# Patient Record
Sex: Female | Born: 1935 | Race: Black or African American | Hispanic: No | Smoking: Never smoker
Health system: Southern US, Community
[De-identification: ages and names within clinical notes are randomized; demographics above are authoritative.]

## PROBLEM LIST (undated history)

## (undated) DIAGNOSIS — K639 Disease of intestine, unspecified: Secondary | ICD-10-CM

## (undated) DIAGNOSIS — R0609 Other forms of dyspnea: Secondary | ICD-10-CM

## (undated) DIAGNOSIS — E7800 Pure hypercholesterolemia, unspecified: Secondary | ICD-10-CM

## (undated) DIAGNOSIS — R011 Cardiac murmur, unspecified: Secondary | ICD-10-CM

## (undated) DIAGNOSIS — R609 Edema, unspecified: Secondary | ICD-10-CM

## (undated) DIAGNOSIS — I10 Essential (primary) hypertension: Secondary | ICD-10-CM

## (undated) DIAGNOSIS — H6120 Impacted cerumen, unspecified ear: Secondary | ICD-10-CM

## (undated) DIAGNOSIS — R0600 Dyspnea, unspecified: Secondary | ICD-10-CM

## (undated) DIAGNOSIS — I493 Ventricular premature depolarization: Secondary | ICD-10-CM

## (undated) DIAGNOSIS — F0390 Unspecified dementia without behavioral disturbance: Secondary | ICD-10-CM

## (undated) DIAGNOSIS — I5032 Chronic diastolic (congestive) heart failure: Secondary | ICD-10-CM

## (undated) DIAGNOSIS — M47812 Spondylosis without myelopathy or radiculopathy, cervical region: Secondary | ICD-10-CM

## (undated) DIAGNOSIS — K5720 Diverticulitis of large intestine with perforation and abscess without bleeding: Secondary | ICD-10-CM

## (undated) DIAGNOSIS — G3184 Mild cognitive impairment, so stated: Secondary | ICD-10-CM

## (undated) HISTORY — DX: Spondylosis without myelopathy or radiculopathy, cervical region: M47.812

## (undated) HISTORY — DX: Mild cognitive impairment of uncertain or unknown etiology: G31.84

## (undated) HISTORY — DX: Essential (primary) hypertension: I10

## (undated) HISTORY — DX: Ventricular premature depolarization: I49.3

## (undated) HISTORY — DX: Pure hypercholesterolemia, unspecified: E78.00

## (undated) HISTORY — DX: Other forms of dyspnea: R06.09

## (undated) HISTORY — DX: Diverticulitis of large intestine with perforation and abscess without bleeding: K57.20

## (undated) HISTORY — DX: Unspecified dementia, unspecified severity, without behavioral disturbance, psychotic disturbance, mood disturbance, and anxiety: F03.90

## (undated) HISTORY — DX: Disease of intestine, unspecified: K63.9

## (undated) HISTORY — DX: Impacted cerumen, unspecified ear: H61.20

## (undated) HISTORY — DX: Edema, unspecified: R60.9

## (undated) HISTORY — DX: Dyspnea, unspecified: R06.00

## (undated) HISTORY — DX: Cardiac murmur, unspecified: R01.1

## (undated) HISTORY — DX: Chronic diastolic (congestive) heart failure: I50.32

---

## 2002-09-01 HISTORY — PX: GALLBLADDER SURGERY: SHX652

## 2021-02-07 ENCOUNTER — Ambulatory Visit: Payer: Self-pay | Admitting: Nurse Practitioner

## 2021-02-20 ENCOUNTER — Emergency Department (HOSPITAL_COMMUNITY): Payer: Medicare Other

## 2021-02-20 ENCOUNTER — Inpatient Hospital Stay (HOSPITAL_COMMUNITY)
Admission: EM | Admit: 2021-02-20 | Discharge: 2021-02-28 | DRG: 065 | Disposition: A | Discharge status: Skilled Nursing Facility | Payer: Medicare Other | Attending: Family Medicine | Admitting: Family Medicine

## 2021-02-20 ENCOUNTER — Observation Stay (HOSPITAL_COMMUNITY): Payer: Medicare Other

## 2021-02-20 DIAGNOSIS — Z781 Physical restraint status: Secondary | ICD-10-CM

## 2021-02-20 DIAGNOSIS — I6389 Other cerebral infarction: Secondary | ICD-10-CM

## 2021-02-20 DIAGNOSIS — R4701 Aphasia: Secondary | ICD-10-CM | POA: Diagnosis present

## 2021-02-20 DIAGNOSIS — R4182 Altered mental status, unspecified: Secondary | ICD-10-CM

## 2021-02-20 DIAGNOSIS — I63412 Cerebral infarction due to embolism of left middle cerebral artery: Secondary | ICD-10-CM | POA: Diagnosis not present

## 2021-02-20 DIAGNOSIS — E785 Hyperlipidemia, unspecified: Secondary | ICD-10-CM | POA: Diagnosis present

## 2021-02-20 DIAGNOSIS — Z806 Family history of leukemia: Secondary | ICD-10-CM

## 2021-02-20 DIAGNOSIS — Z8249 Family history of ischemic heart disease and other diseases of the circulatory system: Secondary | ICD-10-CM

## 2021-02-20 DIAGNOSIS — I639 Cerebral infarction, unspecified: Secondary | ICD-10-CM | POA: Diagnosis not present

## 2021-02-20 DIAGNOSIS — I5022 Chronic systolic (congestive) heart failure: Secondary | ICD-10-CM | POA: Diagnosis present

## 2021-02-20 DIAGNOSIS — G8191 Hemiplegia, unspecified affecting right dominant side: Secondary | ICD-10-CM | POA: Diagnosis present

## 2021-02-20 DIAGNOSIS — Z66 Do not resuscitate: Secondary | ICD-10-CM | POA: Diagnosis present

## 2021-02-20 DIAGNOSIS — R471 Dysarthria and anarthria: Secondary | ICD-10-CM | POA: Diagnosis present

## 2021-02-20 DIAGNOSIS — I480 Paroxysmal atrial fibrillation: Secondary | ICD-10-CM | POA: Diagnosis not present

## 2021-02-20 DIAGNOSIS — I959 Hypotension, unspecified: Secondary | ICD-10-CM | POA: Diagnosis not present

## 2021-02-20 DIAGNOSIS — I130 Hypertensive heart and chronic kidney disease with heart failure and stage 1 through stage 4 chronic kidney disease, or unspecified chronic kidney disease: Secondary | ICD-10-CM | POA: Diagnosis present

## 2021-02-20 DIAGNOSIS — Z20822 Contact with and (suspected) exposure to covid-19: Secondary | ICD-10-CM | POA: Diagnosis present

## 2021-02-20 DIAGNOSIS — N1832 Chronic kidney disease, stage 3b: Secondary | ICD-10-CM | POA: Diagnosis present

## 2021-02-20 DIAGNOSIS — Z823 Family history of stroke: Secondary | ICD-10-CM

## 2021-02-20 DIAGNOSIS — F0390 Unspecified dementia without behavioral disturbance: Secondary | ICD-10-CM | POA: Diagnosis present

## 2021-02-20 LAB — CBC
HCT: 41.1 % (ref 36.0–46.0)
Hemoglobin: 13.2 g/dL (ref 12.0–15.0)
MCH: 30.9 pg (ref 26.0–34.0)
MCHC: 32.1 g/dL (ref 30.0–36.0)
MCV: 96.3 fL (ref 80.0–100.0)
Platelets: 175 10*3/uL (ref 150–400)
RBC: 4.27 MIL/uL (ref 3.87–5.11)
RDW: 13.2 % (ref 11.5–15.5)
WBC: 6.2 10*3/uL (ref 4.0–10.5)
nRBC: 0 % (ref 0.0–0.2)

## 2021-02-20 LAB — DIFFERENTIAL
Abs Immature Granulocytes: 0.02 10*3/uL (ref 0.00–0.07)
Basophils Absolute: 0 10*3/uL (ref 0.0–0.1)
Basophils Relative: 1 %
Eosinophils Absolute: 0.2 10*3/uL (ref 0.0–0.5)
Eosinophils Relative: 2 %
Immature Granulocytes: 0 %
Lymphocytes Relative: 21 %
Lymphs Abs: 1.3 10*3/uL (ref 0.7–4.0)
Monocytes Absolute: 0.6 10*3/uL (ref 0.1–1.0)
Monocytes Relative: 10 %
Neutro Abs: 4.1 10*3/uL (ref 1.7–7.7)
Neutrophils Relative %: 66 %

## 2021-02-20 LAB — PROTIME-INR
INR: 0.9 (ref 0.8–1.2)
Prothrombin Time: 12.6 seconds (ref 11.4–15.2)

## 2021-02-20 LAB — COMPREHENSIVE METABOLIC PANEL
ALT: 10 U/L (ref 0–44)
AST: 22 U/L (ref 15–41)
Albumin: 3.5 g/dL (ref 3.5–5.0)
Alkaline Phosphatase: 62 U/L (ref 38–126)
Anion gap: 9 (ref 5–15)
BUN: 14 mg/dL (ref 8–23)
CO2: 24 mmol/L (ref 22–32)
Calcium: 9.2 mg/dL (ref 8.9–10.3)
Chloride: 107 mmol/L (ref 98–111)
Creatinine, Ser: 1.25 mg/dL — ABNORMAL HIGH (ref 0.44–1.00)
GFR, Estimated: 39 mL/min — ABNORMAL LOW (ref >60)
Glucose, Bld: 98 mg/dL (ref 70–99)
Potassium: 4 mmol/L (ref 3.5–5.1)
Sodium: 140 mmol/L (ref 135–145)
Total Bilirubin: 0.9 mg/dL (ref 0.3–1.2)
Total Protein: 6.1 g/dL — ABNORMAL LOW (ref 6.5–8.1)

## 2021-02-20 LAB — I-STAT CHEM 8, ED
BUN: 18 mg/dL (ref 8–23)
Calcium, Ion: 1.16 mmol/L (ref 1.15–1.40)
Chloride: 108 mmol/L (ref 98–111)
Creatinine, Ser: 1.2 mg/dL — ABNORMAL HIGH (ref 0.44–1.00)
Glucose, Bld: 97 mg/dL (ref 70–99)
HCT: 39 % (ref 36.0–46.0)
Hemoglobin: 13.3 g/dL (ref 12.0–15.0)
Potassium: 4.1 mmol/L (ref 3.5–5.1)
Sodium: 141 mmol/L (ref 135–145)
TCO2: 25 mmol/L (ref 22–32)

## 2021-02-20 LAB — ECHOCARDIOGRAM COMPLETE
AR max vel: 0.84 cm2
AV Area VTI: 0.77 cm2
AV Area mean vel: 0.75 cm2
AV Mean grad: 11.3 mmHg
AV Peak grad: 19 mmHg
Ao pk vel: 2.18 m/s
Area-P 1/2: 4.86 cm2
S' Lateral: 5.2 cm
Weight: 2045.87 oz

## 2021-02-20 LAB — RESP PANEL BY RT-PCR (FLU A&B, COVID) ARPGX2
Influenza A by PCR: NEGATIVE
Influenza B by PCR: NEGATIVE
SARS Coronavirus 2 by RT PCR: NEGATIVE

## 2021-02-20 LAB — APTT: aPTT: 28 seconds (ref 24–36)

## 2021-02-20 LAB — CBG MONITORING, ED: Glucose-Capillary: 88 mg/dL (ref 70–99)

## 2021-02-20 IMAGING — MR MR MRA NECK WO/W CM
7 of 8 series · 40 of 48 positions shown · IV contrast (Gadavist)
Comparison: Previous CT and MRI from earlier the same day.

CLINICAL DATA: Initial evaluation for acute stroke.

EXAM:
MRA HEAD WITHOUT CONTRAST
MRA NECK WITHOUT AND WITH CONTRAST
TECHNIQUE: Angiographic images of the Circle of Willis were obtained using MRA
technique without intravenous contrast. Angiographic images of the
neck were obtained using MRA technique without and with intravenous
contrast. Carotid stenosis measurements (when applicable) are
obtained utilizing NASCET criteria, using the distal internal
carotid diameter as the denominator.
CONTRAST:  5mL GADAVIST GADOBUTROL 1 MMOL/ML IV SOLN

[Series 6: tof_fl3d_tra_iso · axial · 0.6mm · 0.52mm/px · z∈[-230,-154]mm · 8 of 133 slices shown]
[im 1/133]
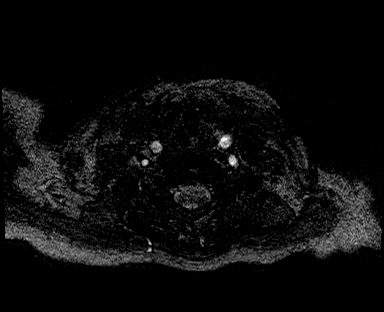
[im 19/133]
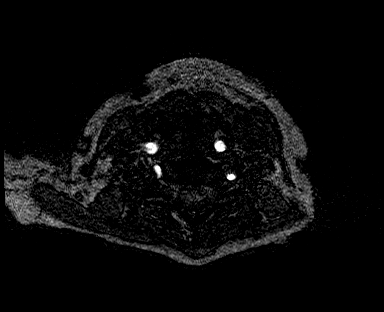
[im 38/133]
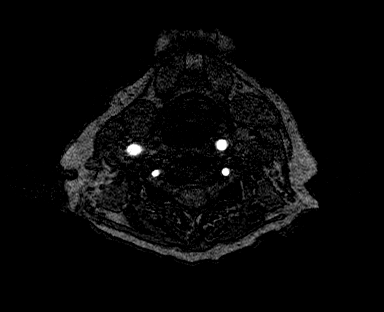
[im 57/133]
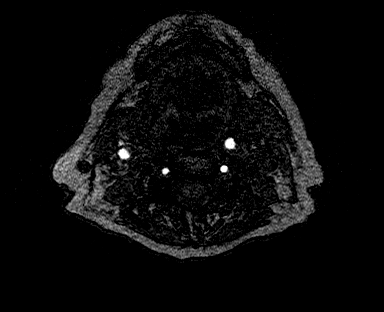
[im 76/133]
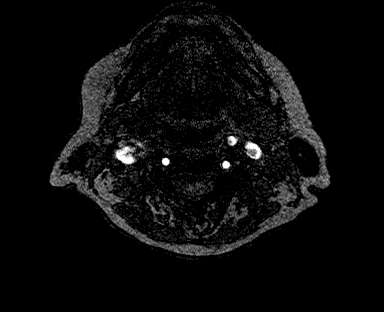
[im 95/133]
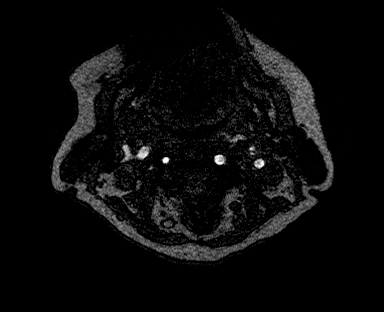
[im 114/133]
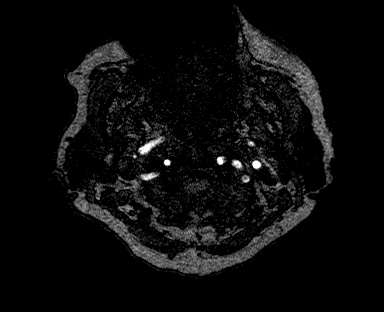
[im 133/133]
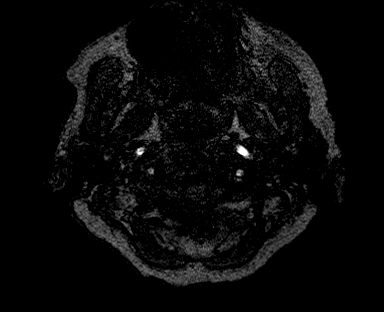

[Series 9: angio_fl3d_cor_pre_ttc=3.0s · coronal · 0.9mm · 0.85mm/px · 5 of 80 slices shown]
[im 1/80]
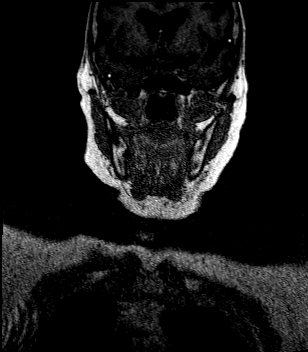
[im 20/80]
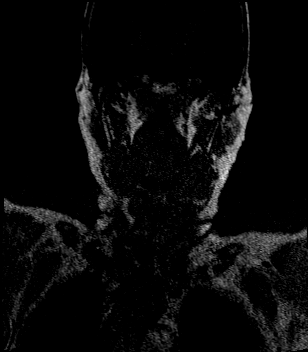
[im 40/80]
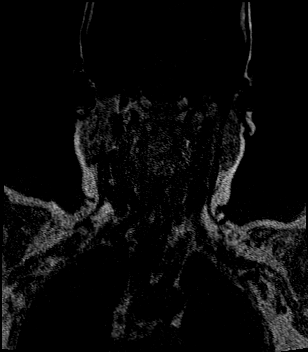
[im 60/80]
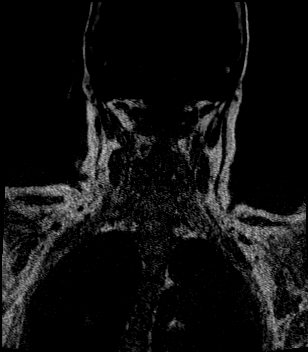
[im 80/80]
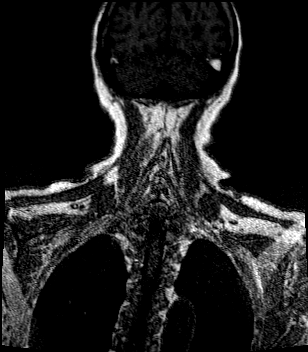

[Series 11: angio_fl3d_cor_post_ttc=3.0s · coronal · 0.9mm · 0.85mm/px · 5 of 80 slices shown (1 of 2)]
[im 1/80]
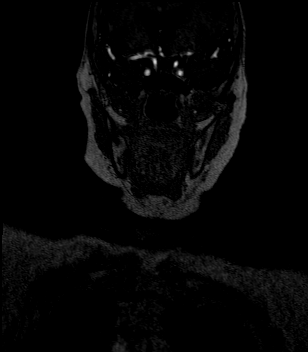
[im 20/80]
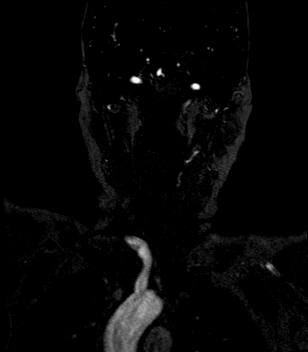
[im 40/80]
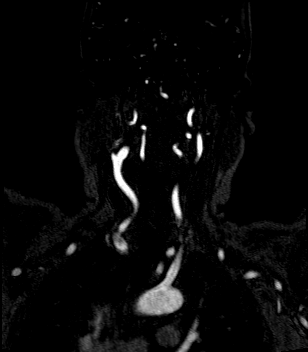
[im 60/80]
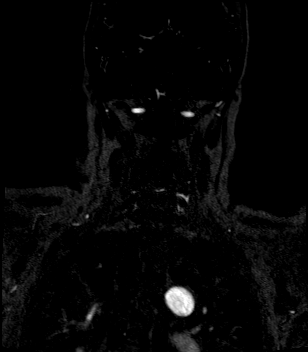
[im 80/80]
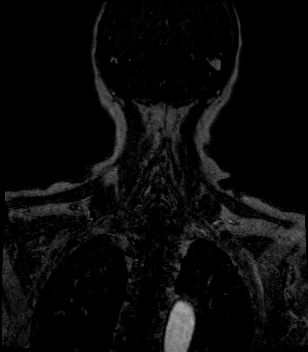

[Series 12: angio_fl3d_cor_post_ttc=3.0s_moco-adv · coronal · 0.9mm · 0.85mm/px · 6 of 80 slices shown (1 of 2)]
[im 1/80]
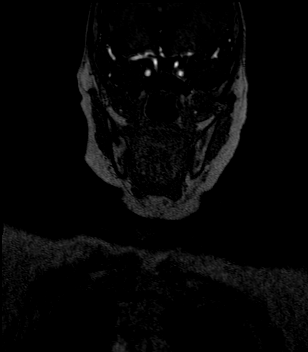
[im 16/80]
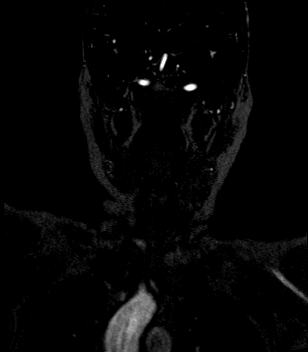
[im 32/80]
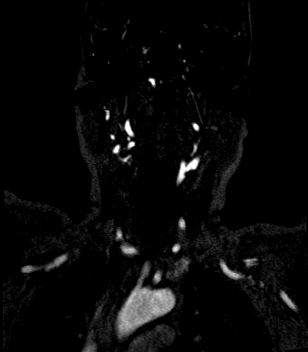
[im 48/80]
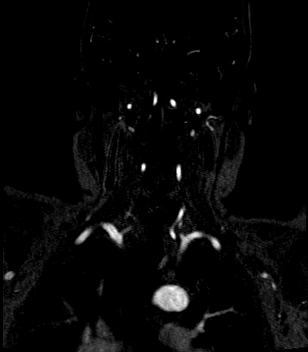
[im 64/80]
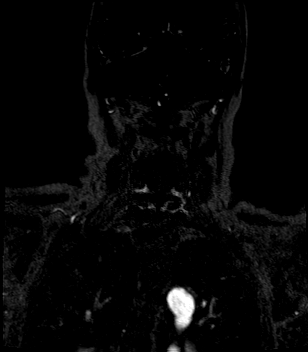
[im 80/80]
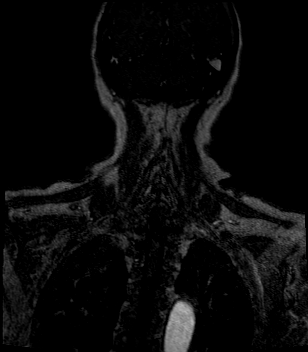

[Series 13: angio_fl3d_cor_post_ttc=3.0s_moco-adv_sub · coronal · 0.9mm · 0.85mm/px · 6 of 80 slices shown]
[im 1/80]
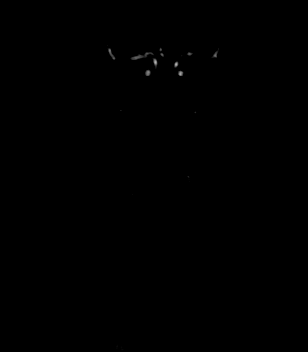
[im 16/80]
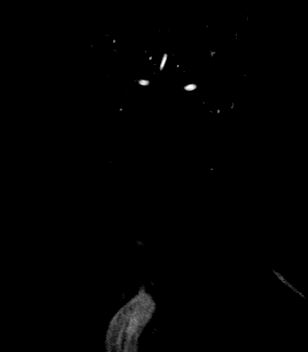
[im 32/80]
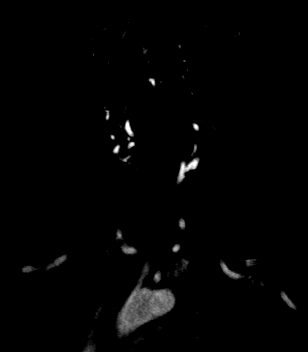
[im 48/80]
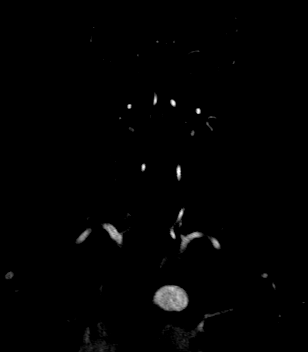
[im 64/80]
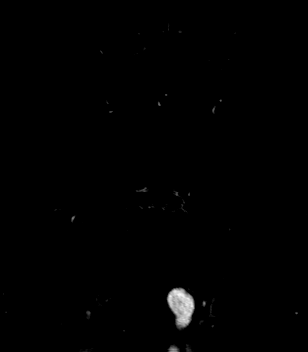
[im 80/80]
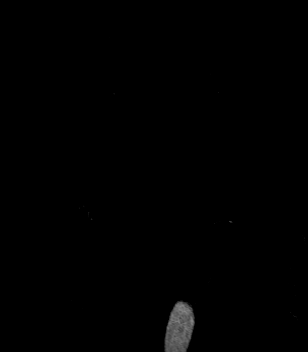

[Series 15: angio_fl3d_cor_post_ttc=3.0s · coronal · 0.9mm · 0.85mm/px · 6 of 80 slices shown (2 of 2)]
[im 1/80]
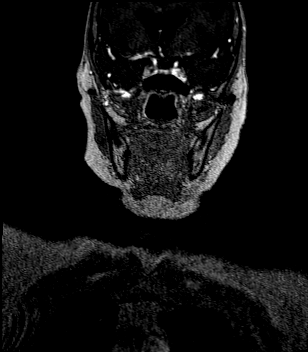
[im 16/80]
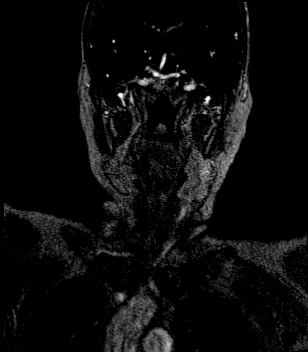
[im 32/80]
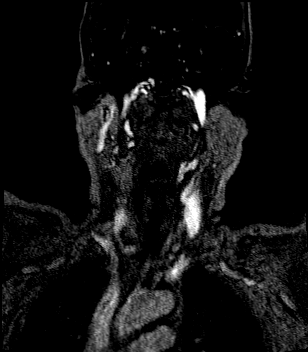
[im 48/80]
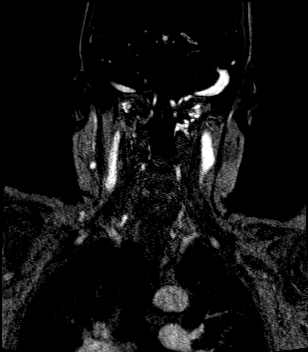
[im 64/80]
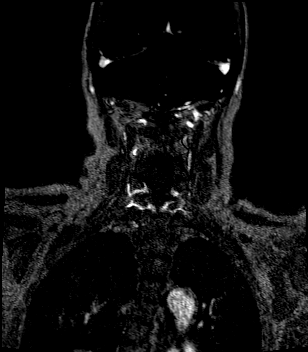
[im 80/80]
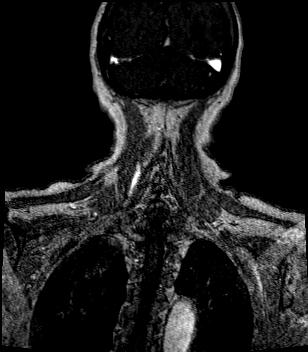

[Series 16: angio_fl3d_cor_post_ttc=3.0s_moco-adv · coronal · 0.9mm · 0.85mm/px · 4 of 80 slices shown (2 of 2)]
[im 1/80]
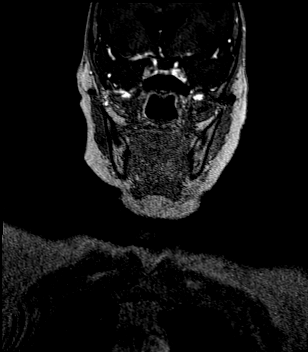
[im 16/80]
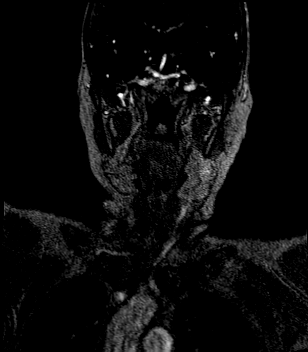
[im 32/80]
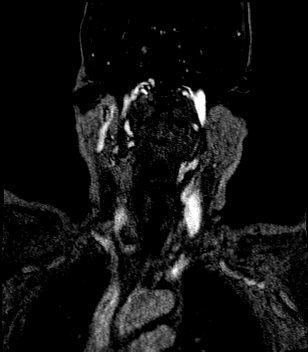
[im 48/80]
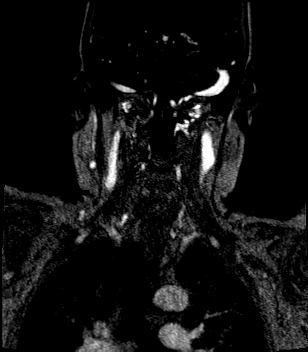

[40 of 48 positions shown; findings below may reference images not displayed]

FINDINGS: MRA HEAD FINDINGS

ANTERIOR CIRCULATION:

Visualized distal cervical segments of the internal carotid arteries
are patent with antegrade flow. Petrous, cavernous, and supraclinoid
segments patent without stenosis. 3 mm focal outpouching at the
expected takeoff of the right posterior communicating artery is seen
(series 7, image 97), favored to reflect a small vascular
infundibulum, although a small aneurysm is difficult to exclude. A1
segments patent bilaterally. Normal anterior communicating artery
complex. Anterior cerebral arteries mildly irregular but are patent
to their distal aspects without high-grade stenosis. M1 segments
patent bilaterally without significant stenosis. Normal MCA
bifurcations. Distal MCA branches well perfused and symmetric,
although demonstrate diffuse small vessel atheromatous irregularity.

POSTERIOR CIRCULATION:

Both vertebral arteries patent to the vertebrobasilar junction
without stenosis. Left vertebral artery slightly dominant. Right
PICA origin patent and normal. Left PICA not definitely seen.
Basilar widely patent to its distal aspect without stenosis.
Superior cerebellar arteries patent bilaterally. Scattered
atheromatous irregularity seen within the PCAs without high-grade
stenosis. PCAs remain well perfused to their distal aspects. A small
left posterior communicating artery noted.

MRA NECK FINDINGS

AORTIC ARCH: Visualized aortic arch normal in caliber with normal 3
vessel morphology. No hemodynamically significant stenosis seen
about the origin of the great vessels.

RIGHT CAROTID SYSTEM: Right CCA mildly tortuous but is patent to the
bifurcation without stenosis. No significant atheromatous narrowing
or irregularity about the right carotid bulb or proximal right ICA.
Right ICA tortuous but widely patent to the skull base without
stenosis, evidence for dissection or occlusion.

LEFT CAROTID SYSTEM: Left CCA mildly tortuous but is widely patent
to the bifurcation. Eccentric plaque at the left carotid
bulb/proximal left ICA with associated stenosis of up to 60% by
NASCET criteria (series [RH], image 7). Left ICA tortuous but is
widely patent distally without stenosis, evidence for dissection, or
occlusion.

VERTEBRAL ARTERIES: Both vertebral arteries arise from the
subclavian arteries. No proximal subclavian artery stenosis.
Vertebral arteries somewhat tortuous but are widely patent without
stenosis, evidence for dissection or occlusion.
IMPRESSION: MRA HEAD IMPRESSION:

1. Negative intracranial MRA for large vessel occlusion.
2. Diffuse intracranial atherosclerotic disease, overall mild for
age. No hemodynamically significant or correctable stenosis.
3. 3 mm focal outpouching arising from the supraclinoid right ICA.
While this is favored to reflect a vascular infundibulum, a small
aneurysm is difficult to exclude. Attention at follow-up
recommended.

MRA NECK IMPRESSION:

1. 60% atheromatous stenosis at the left carotid bulb/proximal left
ICA.
2. No hemodynamically significant stenosis about the right carotid
artery system.
3. Wide patency of the vertebral arteries within the neck.
4. Diffuse tortuosity the major arterial vasculature of the neck,
suggesting chronic underlying hypertension.

## 2021-02-20 IMAGING — MR MR MRA HEAD W/O CM
1 series · 22 of 48 positions shown · IV contrast (gadavist)
Comparison: Previous CT and MRI from earlier the same day.

CLINICAL DATA: Initial evaluation for acute stroke.

EXAM:
MRA HEAD WITHOUT CONTRAST
MRA NECK WITHOUT AND WITH CONTRAST
TECHNIQUE: Angiographic images of the Circle of Willis were obtained using MRA
technique without intravenous contrast. Angiographic images of the
neck were obtained using MRA technique without and with intravenous
contrast. Carotid stenosis measurements (when applicable) are
obtained utilizing NASCET criteria, using the distal internal
carotid diameter as the denominator.
CONTRAST:  5mL GADAVIST GADOBUTROL 1 MMOL/ML IV SOLN

[Series 7: 3d cow · axial · 0.5mm · 0.41mm/px · z∈[-150,-72]mm · 22 of 172 slices shown]
[im 1/172]
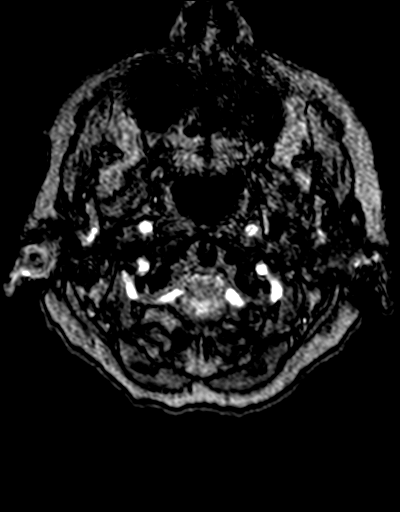
[im 4/172]
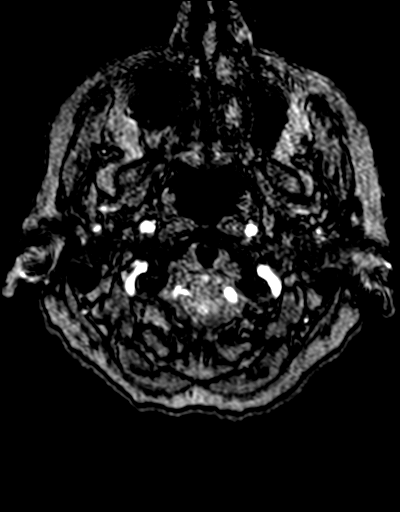
[im 8/172]
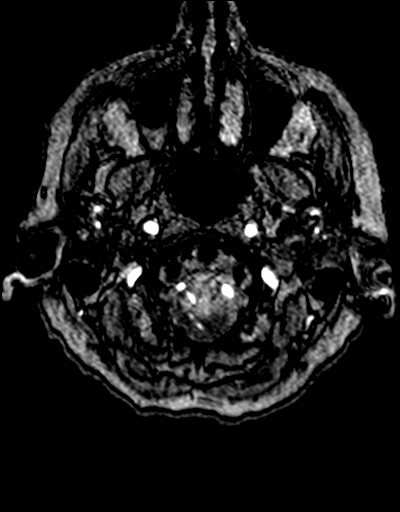
[im 11/172]
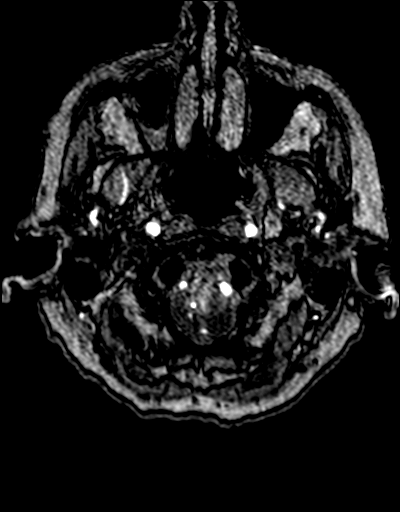
[im 15/172]
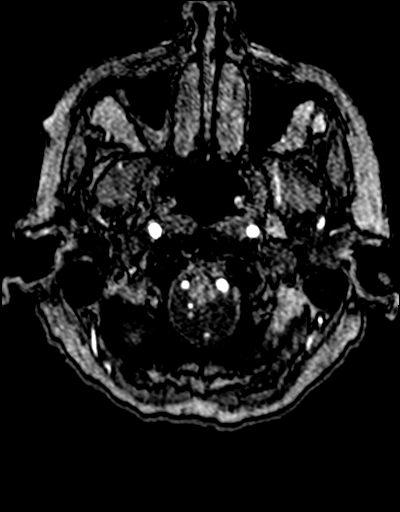
[im 19/172]
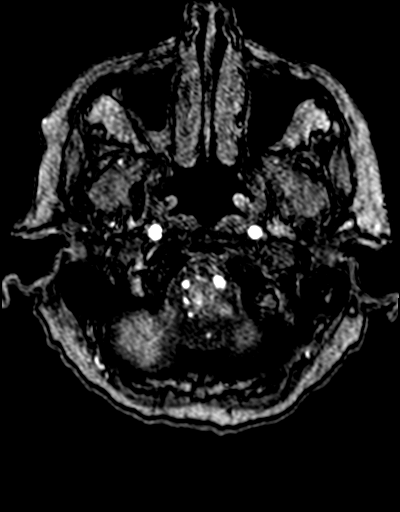
[im 22/172]
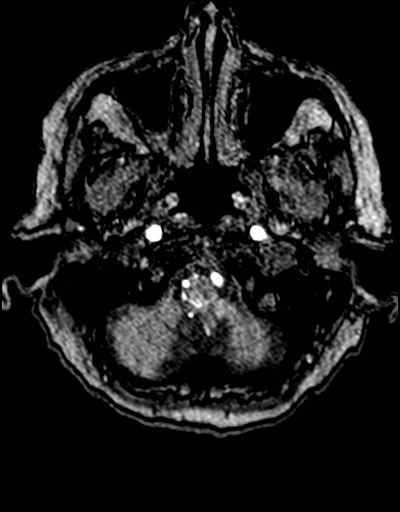
[im 26/172]
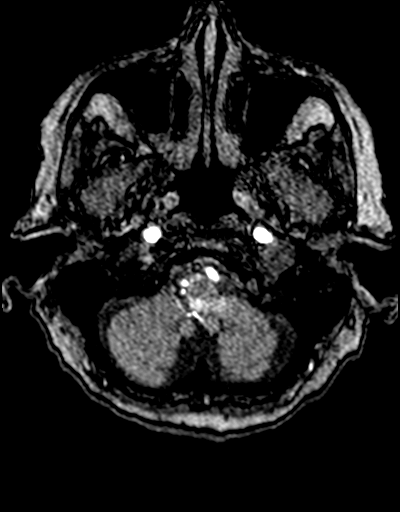
[im 30/172]
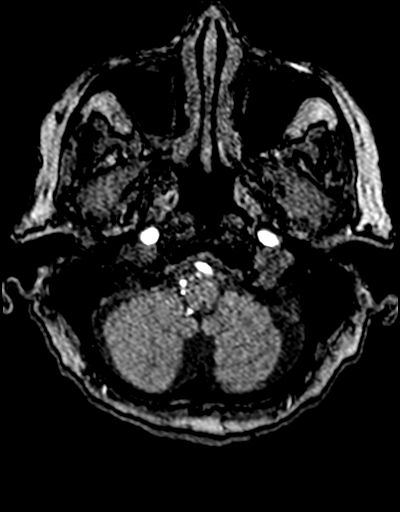
[im 33/172]
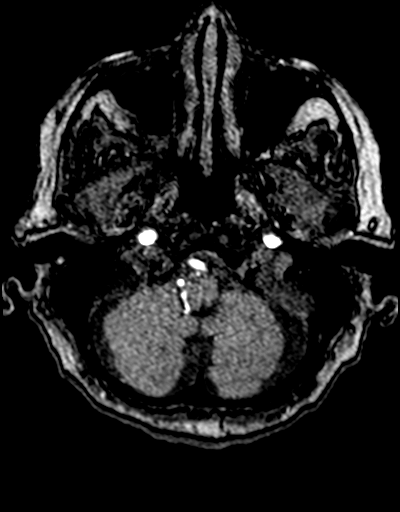
[im 37/172]
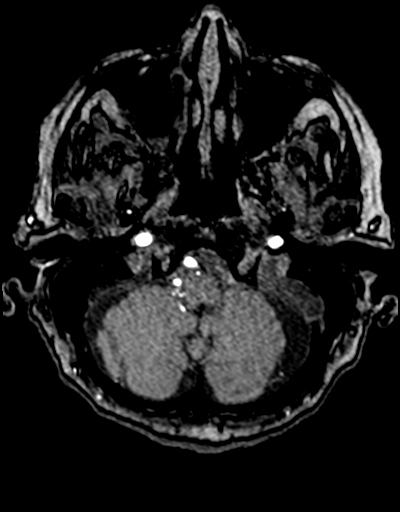
[im 41/172]
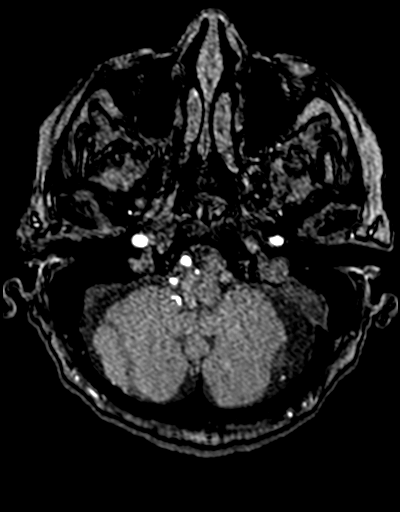
[im 44/172]
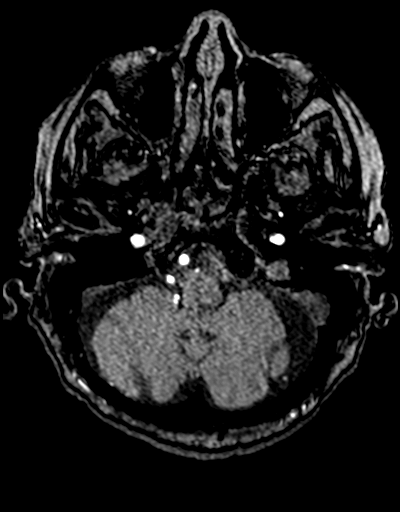
[im 48/172]
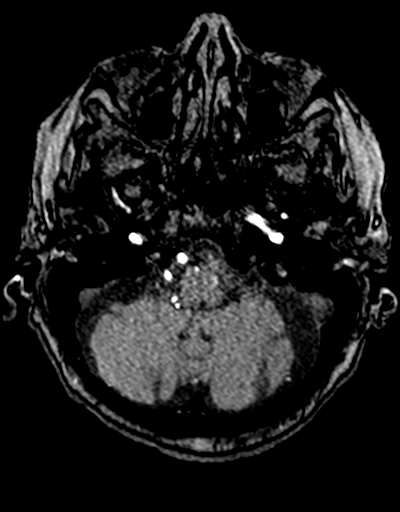
[im 55/172]
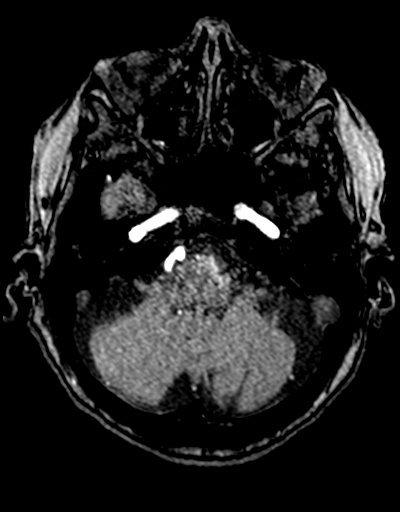
[im 77/172]
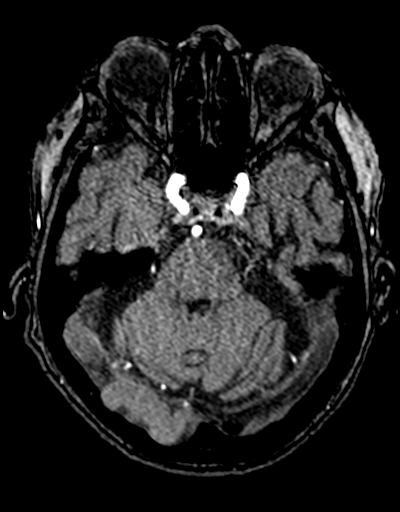
[im 88/172]
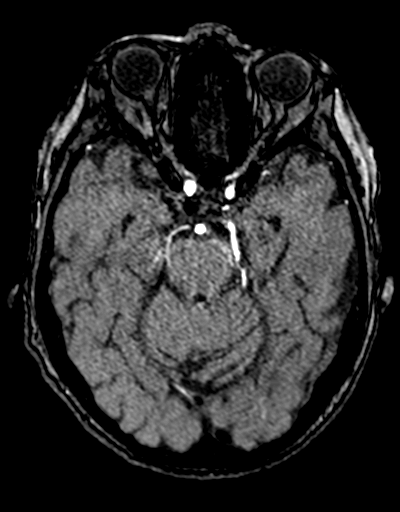
[im 99/172]
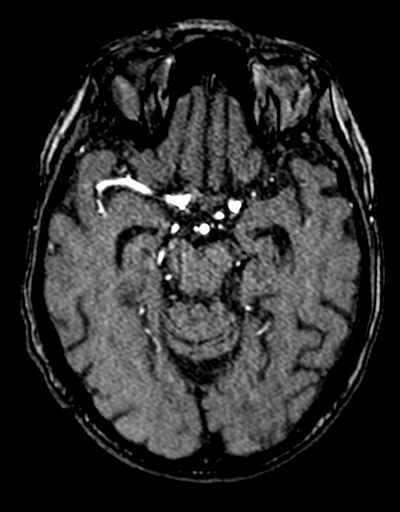
[im 121/172]
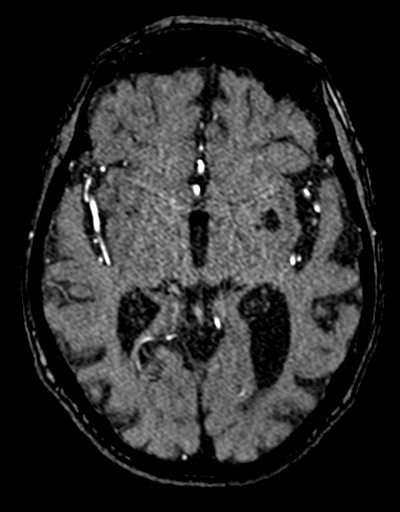
[im 142/172]
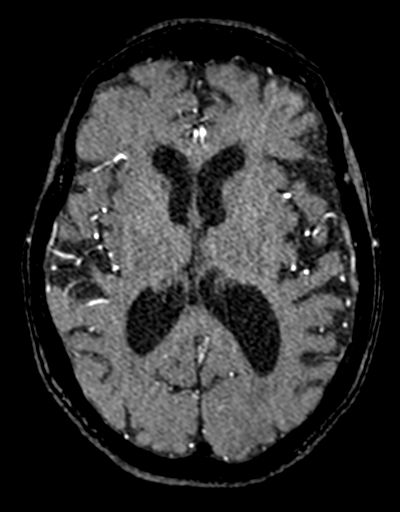
[im 146/172]
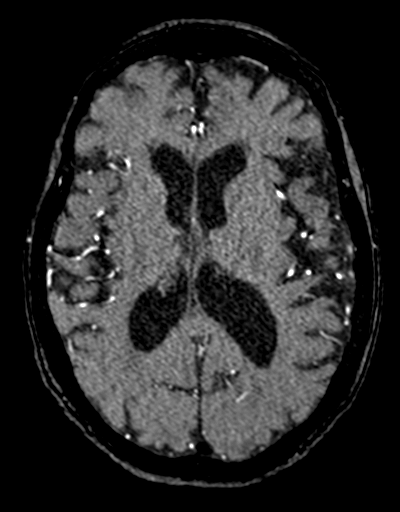
[im 164/172]
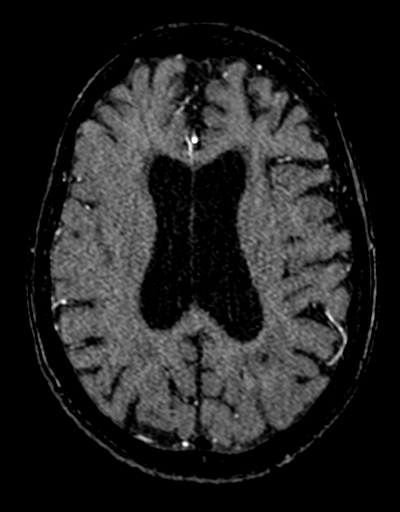

[22 of 48 positions shown; findings below may reference images not displayed]

FINDINGS: MRA HEAD FINDINGS

ANTERIOR CIRCULATION:

Visualized distal cervical segments of the internal carotid arteries
are patent with antegrade flow. Petrous, cavernous, and supraclinoid
segments patent without stenosis. 3 mm focal outpouching at the
expected takeoff of the right posterior communicating artery is seen
(series 7, image 97), favored to reflect a small vascular
infundibulum, although a small aneurysm is difficult to exclude. A1
segments patent bilaterally. Normal anterior communicating artery
complex. Anterior cerebral arteries mildly irregular but are patent
to their distal aspects without high-grade stenosis. M1 segments
patent bilaterally without significant stenosis. Normal MCA
bifurcations. Distal MCA branches well perfused and symmetric,
although demonstrate diffuse small vessel atheromatous irregularity.

POSTERIOR CIRCULATION:

Both vertebral arteries patent to the vertebrobasilar junction
without stenosis. Left vertebral artery slightly dominant. Right
PICA origin patent and normal. Left PICA not definitely seen.
Basilar widely patent to its distal aspect without stenosis.
Superior cerebellar arteries patent bilaterally. Scattered
atheromatous irregularity seen within the PCAs without high-grade
stenosis. PCAs remain well perfused to their distal aspects. A small
left posterior communicating artery noted.

MRA NECK FINDINGS

AORTIC ARCH: Visualized aortic arch normal in caliber with normal 3
vessel morphology. No hemodynamically significant stenosis seen
about the origin of the great vessels.

RIGHT CAROTID SYSTEM: Right CCA mildly tortuous but is patent to the
bifurcation without stenosis. No significant atheromatous narrowing
or irregularity about the right carotid bulb or proximal right ICA.
Right ICA tortuous but widely patent to the skull base without
stenosis, evidence for dissection or occlusion.

LEFT CAROTID SYSTEM: Left CCA mildly tortuous but is widely patent
to the bifurcation. Eccentric plaque at the left carotid
bulb/proximal left ICA with associated stenosis of up to 60% by
NASCET criteria (series [RH], image 7). Left ICA tortuous but is
widely patent distally without stenosis, evidence for dissection, or
occlusion.

VERTEBRAL ARTERIES: Both vertebral arteries arise from the
subclavian arteries. No proximal subclavian artery stenosis.
Vertebral arteries somewhat tortuous but are widely patent without
stenosis, evidence for dissection or occlusion.
IMPRESSION: MRA HEAD IMPRESSION:

1. Negative intracranial MRA for large vessel occlusion.
2. Diffuse intracranial atherosclerotic disease, overall mild for
age. No hemodynamically significant or correctable stenosis.
3. 3 mm focal outpouching arising from the supraclinoid right ICA.
While this is favored to reflect a vascular infundibulum, a small
aneurysm is difficult to exclude. Attention at follow-up
recommended.

MRA NECK IMPRESSION:

1. 60% atheromatous stenosis at the left carotid bulb/proximal left
ICA.
2. No hemodynamically significant stenosis about the right carotid
artery system.
3. Wide patency of the vertebral arteries within the neck.
4. Diffuse tortuosity the major arterial vasculature of the neck,
suggesting chronic underlying hypertension.

## 2021-02-20 IMAGING — CT CT HEAD CODE STROKE
4 series · 16 of 47 positions shown, 18 images · non-contrast
Comparison: None.

CLINICAL DATA: Code stroke.  Right-sided weakness and facial droop

EXAM:
CT HEAD WITHOUT CONTRAST
TECHNIQUE: Contiguous axial images were obtained from the base of the skull
through the vertex without intravenous contrast.

[Series 2: head wo · axial · 0.45mm/px · z∈[+1285,+1400]mm · 7 of 31 slices shown, 9 images]
[im 4/31  brain]
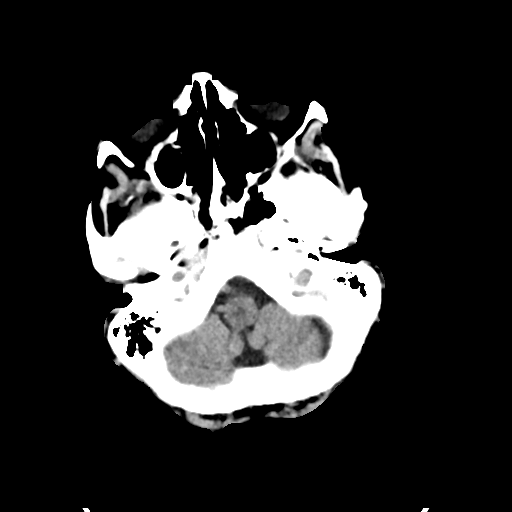
[im 4/31  bone]
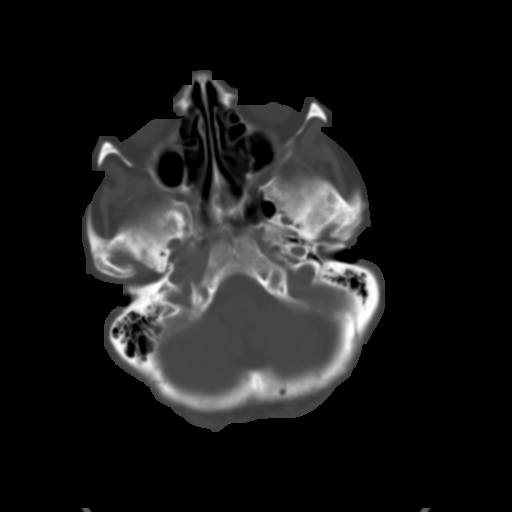
[im 8/31  brain]
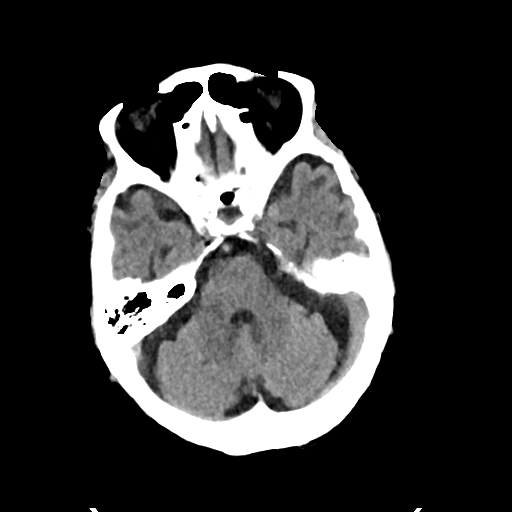
[im 12/31  brain]
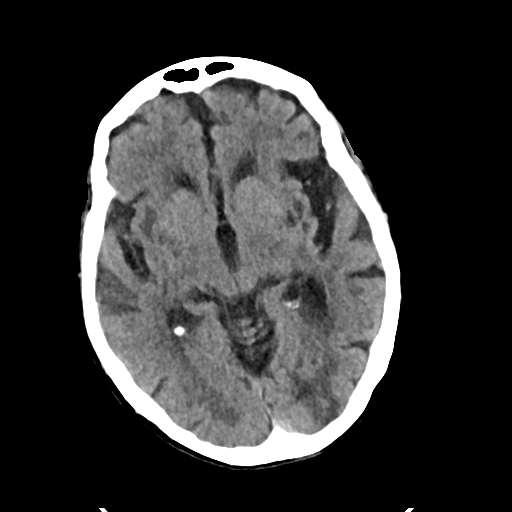
[im 16/31  brain]
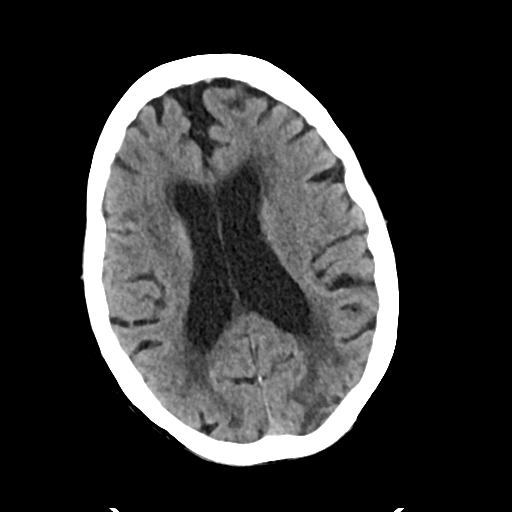
[im 19/31  brain]
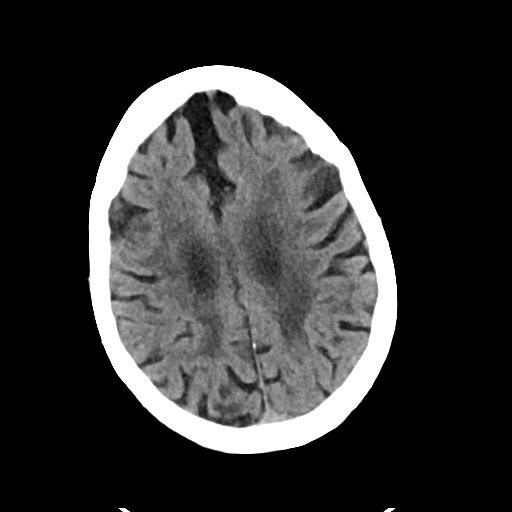
[im 19/31  bone]
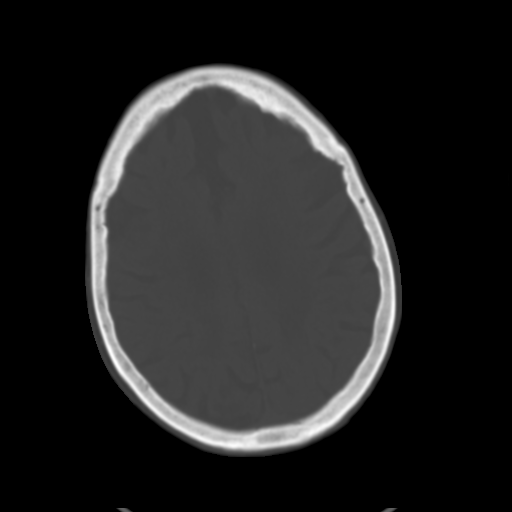
[im 23/31  brain]
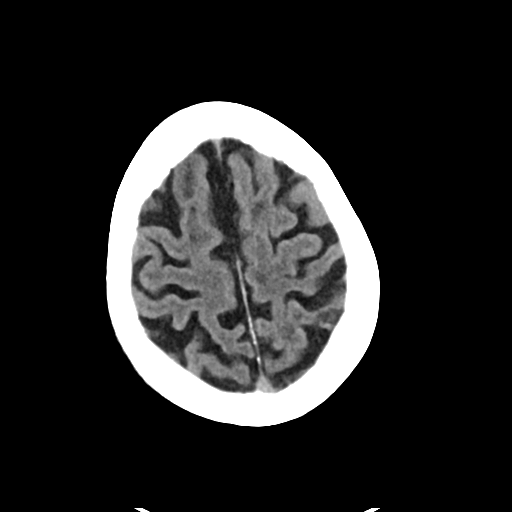
[im 27/31  brain]
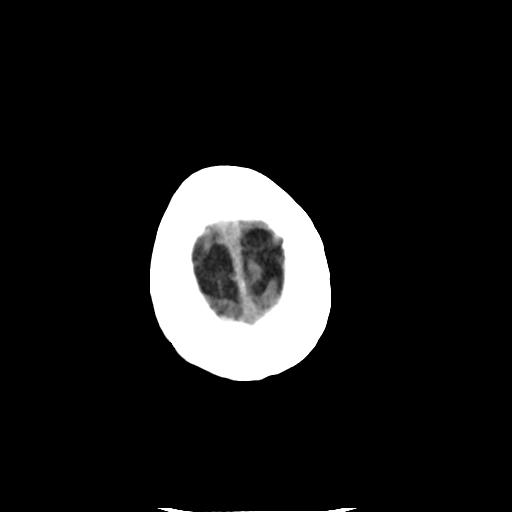

[Series 3: head bone · axial · 0.45mm/px · z∈[+1284,+1314]mm · 3 of 76 slices shown]
[im 8/76  bone]
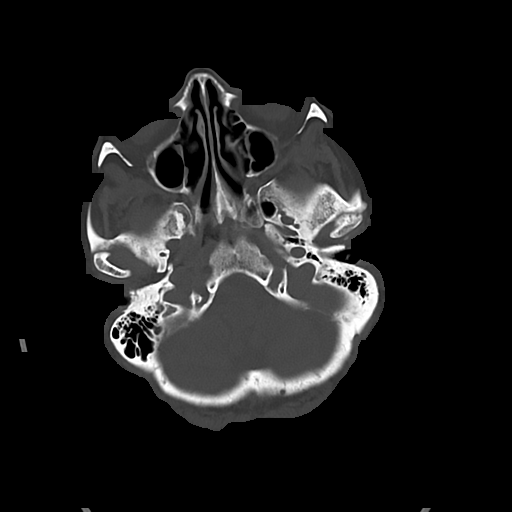
[im 16/76  bone]
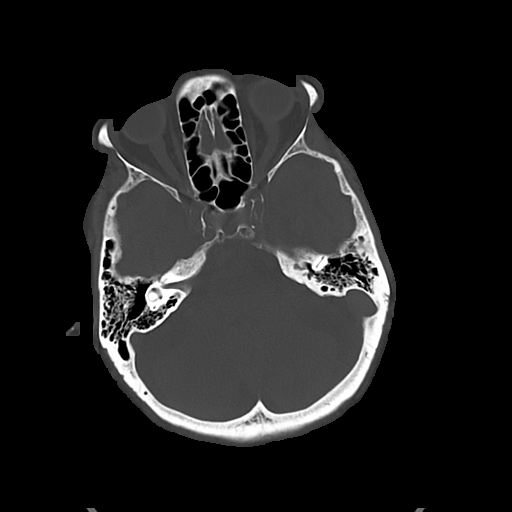
[im 23/76  bone]
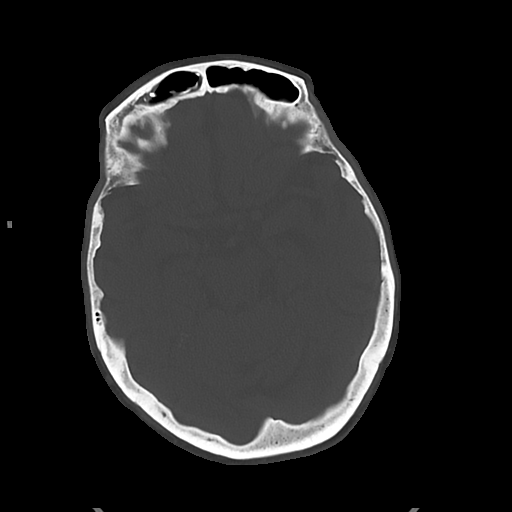

[Series 5: cor soft · coronal · 0.32mm/px · 3 of 65 slices shown]
[im 22/65  brain]
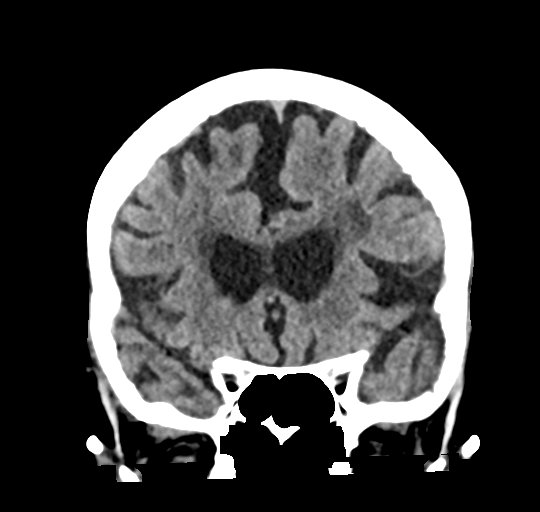
[im 29/65  brain]
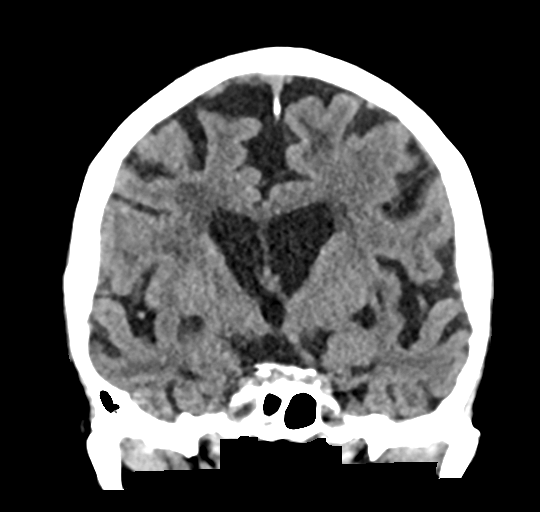
[im 36/65  brain]
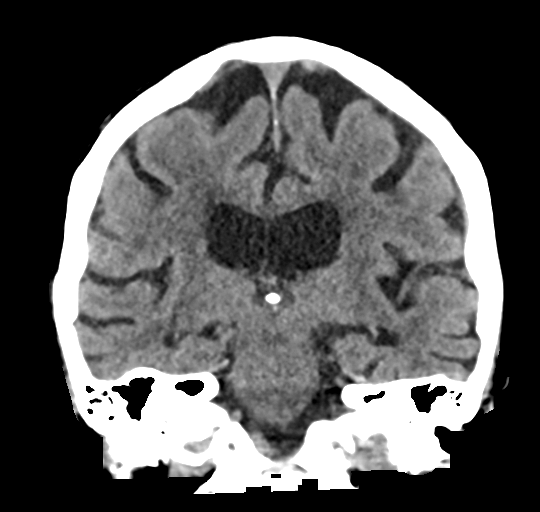

[Series 6: sag soft · sagittal · 0.31mm/px · 3 of 51 slices shown]
[im 17/51  brain]
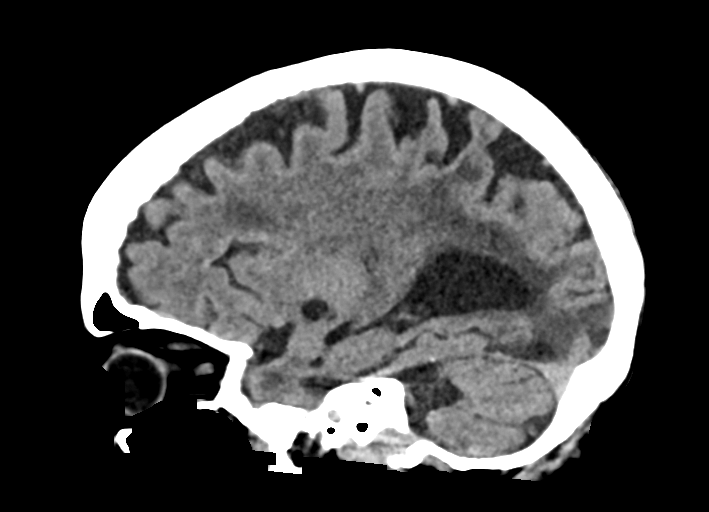
[im 26/51  brain]
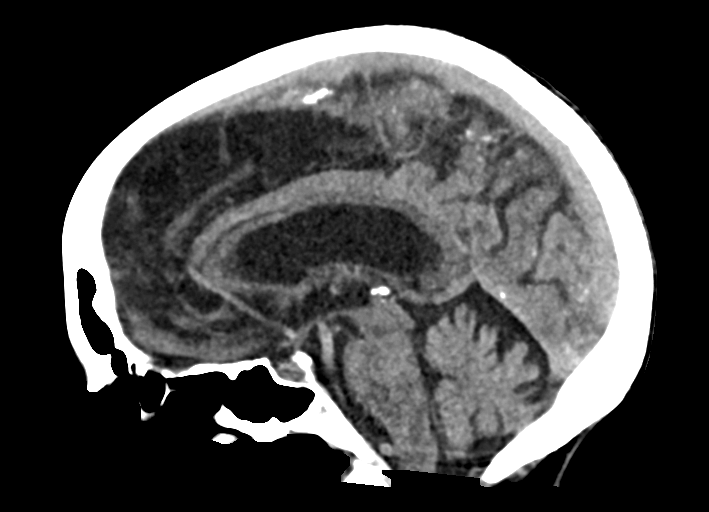
[im 34/51  brain]
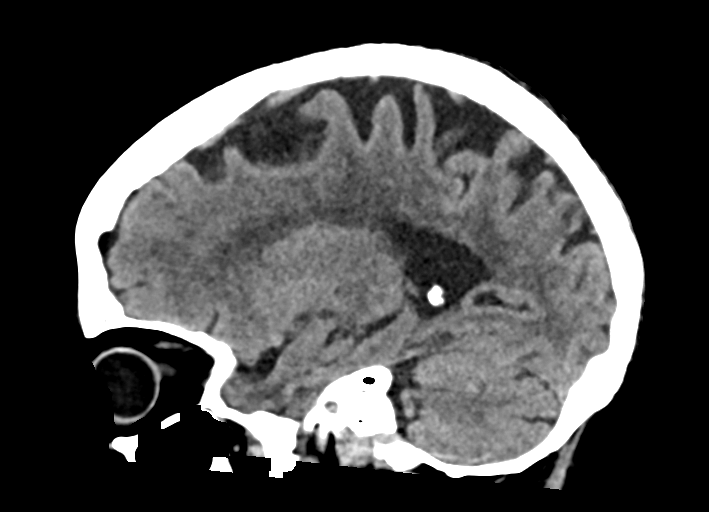

[16 of 47 positions shown; findings below may reference images not displayed]

FINDINGS: Brain: No acute intracranial hemorrhage, mass effect, or edema.
Likely a chronic infarcts of bilateral parietal and left occipital
lobes.

Additional patchy and confluent areas of low-attenuation in the
supratentorial white matter nonspecific but may reflect moderate
chronic microvascular ischemic changes. Prominence of the ventricles
and sulci reflects generalized parenchymal volume loss. No
extra-axial collection.

Vascular: No hyperdense vessel. There is intracranial
atherosclerotic calcification at the skull base.

Skull: Unremarkable.

Sinuses/Orbits: Mild right maxillary sinus mucosal thickening. No
significant orbital abnormality.

Other: Mastoid air cells are clear.

ASPECTS ([place] Stroke Program Early CT Score)

- Ganglionic level infarction (caudate, lentiform nuclei, internal
capsule, insula, M1-M3 cortex):

- Supraganglionic infarction (M4-M6 cortex):

Total score (0-10 with 10 being normal):
IMPRESSION: There is no acute intracranial hemorrhage or evidence of acute
infarction. ASPECT score is 10.

Chronic infarcts of both parietal and left occipital lobes. Moderate
chronic microvascular ischemic changes.

## 2021-02-20 MED ORDER — ASPIRIN 325 MG PO TABS
325.0000 mg | ORAL_TABLET | Freq: Every day | ORAL | Status: DC
  Administered 2021-02-20: 325 mg via ORAL
  Filled 2021-02-20: qty 1

## 2021-02-20 MED ORDER — ASPIRIN EC 81 MG PO TBEC
81.0000 mg | DELAYED_RELEASE_TABLET | Freq: Every day | ORAL | Status: DC
  Administered 2021-02-21 – 2021-02-28 (×6): 81 mg via ORAL
  Filled 2021-02-20 (×7): qty 1

## 2021-02-20 MED ORDER — ASPIRIN 300 MG RE SUPP: 300.0000 mg | Freq: Every day | RECTAL | Status: DC

## 2021-02-20 MED ORDER — GADOBUTROL 1 MMOL/ML IV SOLN
5.0000 mL | Freq: Once | INTRAVENOUS | Status: AC | PRN
  Administered 2021-02-20: 5 mL via INTRAVENOUS

## 2021-02-20 MED ORDER — ACETAMINOPHEN 650 MG RE SUPP: 650.0000 mg | RECTAL | Status: DC | PRN

## 2021-02-20 MED ORDER — SODIUM CHLORIDE 0.9% FLUSH: 3.0000 mL | Freq: Once | INTRAVENOUS | Status: DC

## 2021-02-20 MED ORDER — ACETAMINOPHEN 325 MG PO TABS
650.0000 mg | ORAL_TABLET | ORAL | Status: DC | PRN
  Administered 2021-02-21: 650 mg via ORAL
  Filled 2021-02-20: qty 2

## 2021-02-20 MED ORDER — ACETAMINOPHEN 160 MG/5ML PO SOLN: 650.0000 mg | ORAL | Status: DC | PRN

## 2021-02-20 MED ORDER — STROKE: EARLY STAGES OF RECOVERY BOOK
Freq: Once | Status: AC
  Filled 2021-02-20: qty 1

## 2021-02-20 NOTE — Hospital Course (Addendum)
Kristin Briggs is a 87 year old female presented with dysarthria, found to have acute ischemic stroke.  PMH is significant for colon abnormality.  Left MCA Ischemia Stroke Patient presented with dysarthria and decreased coordination of her right arm/hand. MR brain: acute infarct in posterior left frontal lobe involving corona radiata extending to the cortex; more subacute appearing left occipital infarct; few additional punctate acute or subacute left frontoparietal and right frontal infarcts. Neurology recommneded high intensity statin (Lipitor) and ASA.   New onset diastolic heart failure Echocardiogram showed global hypokinesis with akinesis of the posterior lateral and basal to mid inferior myocardium.  EF <20%, moderately dilated left atrium mild aortic valve stenosis and calcification.  Cardiology consult was not done as patient did not want aggressive management and preference for symptomatic control***.   New onset Paroxysmal A-Fib with RVR Patient had episode of this with hypotension.  Improved with dose of IV Diltiazem 10 mg and 250 mL fluid bolus.  Patient switched to Digoxin to avoid hypotension.  Converted to normal sinus and remained in normal sinus by time of discharge.***  Goals of Care Pt DNR prior to admission. Family meeting on 11/26 with plans to have reassessment from PT if patient CIR eligible due to clinical improvement.  Patient did not meet CIR criteria.   ***    All other chronic conditions were stable during hospitalization.  Issues for follow-up: Consider restarting anticoagulation as patient is at high risk for recurrent stroke. Started patient on metoprolol succinate 12.5mg  daily

## 2021-02-20 NOTE — H&P (Addendum)
Family Medicine Teaching [facility] Admission History and Physical Service Pager: [phone]  Patient name: Kristin Briggs Medical record number: [id number] Date of birth: Aug 22, 1920 Age: 87 y.o. Gender: female  Primary Care Provider: Pcp, No Consultants: Neurology Code Status: DNR (daughter has signed DNR form from [place]) Preferred Emergency Contact: Ulice Brilliant [phone]  Chief Complaint: Altered speech   Assessment and Plan: Kristin Briggs is a 87 y.o. female presenting with dysarthria, found to have code stroke. PMH is significant for colon abnormality (?diverticulitis).  Cerebrovascular event Patient presented with dysarthria and decreased coordination of her right arm/hand. Last known normal around 2330 on 6/21 and woke up with symptoms at 1200 on 6/22. Code stroke was initiated in the ED, CT head without evidence of intracranial hemorrhage or acute infarct (ASPECT score is 10). MR brain: acute infarct in posterior left frontal lobe involving corona radiata extending to the cortex; more subacute appearing left occipital infarct; few additional punctate acute or subacute left frontoparietal and right frontal infarcts; several chronic infarcts and microvascular ischemic changes. Risk factors for stroke include age, race; no known other risk factors, will obtain risk stratification labs. BP 140/66 upon admission.  Neurology consulted, discussed with Dr. Derry Lory who said to place patient on ASA only (no plavix) due to patient's age and to obtain MRA of head and neck.  - admit as tele- obs under attending Dr. Deirdre Priest - vitals per floor protocol - neuro following; appreciate recs - monitor on telemetry - echo - MRA head and neck - permissive hypertension x24 hours - risk strat labs: hgb A1C, lipid, TSH - swallow eval - PT/OT - ASA 325mg  Qd - No Plavix per neurology   CKD stage IIIb No known history of CKD or chronic diseases other than dementia. Creatinine on  admission 1.25>1.20 with GFR of 39.  - Continue to monitor with BMP - Avoid nephrotoxic agents    FEN/GI: Awaiting speech evaluation Prophylaxis: SCDs  Disposition: Med-tele observation  History of Present Illness:  Kristin Briggs is a 87 y.o. female presenting with altered speech.  Patient with dementia, history obtained from daughter.  Daughter states that last night and around 11:30 PM, patient was at her normal baseline and went to bed.  This morning around noon she woke up and was getting ready and knocked over a chair and was unable to get around and became "hysterical". Once daughter was able to get to her they walked to the bathroom. She was fumbling right arm/hand. Daughter had to get pt to grip onto the walker so she didn't fall but was slumped over a little bit. Patient was slurring her words. Around 12:42 pm daughter noticed a possible right facial droop.  No previous stroke. Normally shuffles feet. Slurring words has not improved much since arrival. She can normally carry out a conversation but is known to ask her daughter to repeat. Often gets "hysterical" when upset, but daughter is able to calm her down.   She is able to eat all foods and can feed herself. Daughter-in-law is a CNA and gives her showers at home. Pt is able to groom herself and can make herself a cup of coffee/ PB&J sandwhich. Does not cook for herself. No stairs at home. No falls. She has had some slips where she ended up on the floor. But none in the last 5-6 months. Bruising on the foot after a chair onto her left foot a few days ago. Bruising improving.    In ED, CODE STROKE  called. No tPA given due to location of CVA. Subacute and acute CVA seen on imaging.   PMHx: cognitive impairment in 2005-6. Hospitalized in 2017 for colitis for 5 days. No hx of HTN or DM. Does not take any medications. No hx of bleeding disorders.  Surg Hx: gallbladder 2004-5, cataract surgery  Moved from [place] a month ago.    Review  Of Systems: Per HPI with the following additions: Patient has dementia so her personal ROS was completely negative. Per daughter, patient had facial drooping a few hours prior, was having difficulty with coordinating her right arm, slurred speech with some word finding difficulty  Review of Systems  Unable to perform ROS: Dementia  Eyes:  Negative for visual disturbance.  Respiratory:  Negative for shortness of breath.   Cardiovascular:  Negative for chest pain and palpitations.  Gastrointestinal:  Negative for abdominal pain, diarrhea and vomiting.  Neurological:  Positive for weakness. Negative for dizziness and headaches.    Patient Active Problem List   Diagnosis Date Noted   Acute ischemic stroke (HCC) 02/20/2021    Past Medical History: Past Medical History:  Diagnosis Date   Colon abnormality    Inflammed, Per [facility] New Patient Packet    Past Surgical History: Past Surgical History:  Procedure Laterality Date   GALLBLADDER SURGERY  2004   Per [facility] New Patient Packet    Social History: Social History   Tobacco Use   Smoking status: Never   Smokeless tobacco: Never  Vaping Use   Vaping Use: Never used  Substance Use Topics   Alcohol use: Not Currently   Drug use: Never   Additional social history:  Moved from [place] about a month ago.  Please also refer to relevant sections of EMR.  Family History: Family History  Problem Relation Age of Onset   Leukemia Mother    Stroke Father    Heart attack Sister    Seizures Sister    Leukemia Sister    Stroke Brother    Ulcerative colitis Daughter     Allergies and Medications: Not on File No current facility-administered medications on file prior to encounter.   No current outpatient medications on file prior to encounter.    Objective: BP 122/75 (BP Location: Right Arm)   Pulse 83   Temp 98.1 F (36.7 C) (Oral)   Resp 18   Wt 58 kg   SpO2 97%  Exam: General -- pleasant with some memory difficulty, daughter  at bedside HEENT -- Head is normocephalic. PERRLA. EOMI. Ears, nose and throat were benign. No facial droop noted Neck -- supple; no bruits. Integument -- intact. No rash, erythema. Bruising on right lateral foot/toes Chest -- good expansion. Lungs clear to auscultation. Cardiac -- RRR. Systolic murmur appreciated Abdomen -- soft, nontender. No masses palpable. Bowel sounds present. Extremities - no tenderness or effusions noted. ROM good. 5/5 bilateral strength. Dorsalis pedis pulses present and symmetrical.  Neuro: CN II: PERRL CN III, IV,VI: EOMI CV V: Normal sensation in V1, V2, V3 CVII: Symmetric smile and brow raise CN VIII: Normal hearing CN IX,X: Symmetric palate raise  CN XI: 5/5 shoulder shrug CN XII: Symmetric tongue protrusion  BLE 5/5 strength, RUE with decreased grip strength and 4/5 strength compared to LUE Note repetitive speech with some mild dysarthria, able to follow commands Skin: lateral left foot bruising, improving per daughter   Labs and Imaging: CBC BMET  Recent Labs  Lab 02/20/21 1315 02/20/21 1323  WBC 6.2  --  HGB 13.2 13.3  HCT 41.1 39.0  PLT 175  --    Recent Labs  Lab 02/20/21 1315 02/20/21 1323  NA 140 141  K 4.0 4.1  CL 107 108  CO2 24  --   BUN 14 18  CREATININE 1.25* 1.20*  GLUCOSE 98 97  CALCIUM 9.2  --      EKG: Not yet completed   MR BRAIN WO CONTRAST  Result Date: 02/20/2021 CLINICAL DATA:  Right-sided weakness and facial droop EXAM: MRI HEAD WITHOUT CONTRAST TECHNIQUE: Multiplanar, multiecho pulse sequences of the brain and surrounding structures were obtained without intravenous contrast. COMPARISON:  Correlation made with same day CT FINDINGS: Brain: There is restricted diffusion in the posterior left frontal lobe involving the corona radiata extending to the cortex with some involvement of the lateral precentral gyrus. Area of reduced diffusion in the left occipital lobe with ADC isointensity relative to cortex. Few  additional punctate foci of cortical diffusion hyperintensity in the left frontal and parietal lobes as well as right frontal lobe. Chronic bilateral parietooccipital infarcts with some corresponding chronic blood products. Chronic small vessel infarcts of the central white matter and subinsular white matter. Small chronic infarcts of the right cerebellum. Additional patchy and confluent areas of T2 hyperintensity in the supratentorial white matter are nonspecific but probably reflect moderate chronic microvascular ischemic changes. Few additional punctate foci of susceptibility hypointensity likely reflect chronic microhemorrhages. Prominence of the ventricles and sulci reflects generalized parenchymal volume loss. No significant mass effect.  No hydrocephalus. Vascular: Major vessel flow voids at the skull base are preserved. Skull and upper cervical spine: Normal marrow signal is preserved. Sinuses/Orbits: Paranasal sinuses are aerated. Orbits are unremarkable. Other: Sella is unremarkable.  Mastoid air cells are clear. IMPRESSION: Acute infarct of the posterior left frontal lobe involving corona radiata extending to the cortex. Some involvement of lateral precentral gyrus. More subacute appearing left occipital infarct. Few additional punctate acute or subacute left frontoparietal and right frontal infarcts. Several chronic infarcts and moderate chronic microvascular ischemic changes. Initial results were reviewed by telephone at the time of interpretation on 02/20/2021 at 1:51 pm with provider [facility] , who verbally acknowledged these results. Electronically Signed   By: Guadlupe Spanish M.D.   On: 02/20/2021 14:00   CT HEAD CODE STROKE WO CONTRAST  Result Date: 02/20/2021 CLINICAL DATA:  Code stroke.  Right-sided weakness and facial droop EXAM: CT HEAD WITHOUT CONTRAST TECHNIQUE: Contiguous axial images were obtained from the base of the skull through the vertex without intravenous contrast.  COMPARISON:  None. FINDINGS: Brain: No acute intracranial hemorrhage, mass effect, or edema. Likely a chronic infarcts of bilateral parietal and left occipital lobes. Additional patchy and confluent areas of low-attenuation in the supratentorial white matter nonspecific but may reflect moderate chronic microvascular ischemic changes. Prominence of the ventricles and sulci reflects generalized parenchymal volume loss. No extra-axial collection. Vascular: No hyperdense vessel. There is intracranial atherosclerotic calcification at the skull base. Skull: Unremarkable. Sinuses/Orbits: Mild right maxillary sinus mucosal thickening. No significant orbital abnormality. Other: Mastoid air cells are clear. ASPECTS [facility] Stroke Program Early CT Score) - Ganglionic level infarction (caudate, lentiform nuclei, internal capsule, insula, M1-M3 cortex): - Supraganglionic infarction (M4-M6 cortex): Total score (0-10 with 10 being normal): IMPRESSION: There is no acute intracranial hemorrhage or evidence of acute infarction. ASPECT score is 10. 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 Moderate chronic microvascular ischemic changes. Electronically Signed   By: Guadlupe Spanish M.D.   On:  02/20/2021 13:34     Evelena LeydenLilland, Alana, DO 02/20/2021, 3:56 PM PGY-1, [facility] FPTS Intern pager: [phone], text pages welcome  FPTS Upper-Level Resident Addendum   I have independently interviewed and examined the patient. I have discussed the above with the original author and agree with their documentation. My edits for correction/addition/clarification are within the note. Please see also any attending notes.   Katha CabalVondra Lothar Prehn, DO PGY-2, [place] [facility] 02/20/2021 3:59 PM  FPTS Service pager: [phone] (text pages welcome through [employer])

## 2021-02-20 NOTE — Code Documentation (Signed)
Patient from home where she was LKW around 2330 on 6/21 by family. She woke up today at 1200 and was seen with right sided weakness and slurred speech. Family called 35 and [facility] arrived. BP 168/70, CBG 95. They activated a code stroke and brought patient to [facility]. She was met by the stroke team and cleared by the EDP. On arrival her assessment showed right sided weakness, slight dysarthria and aphasia. NIHSS 8 (see documentation for specifics). She was taken for CT scan and then an MRI to decide if a this was a "wakeup stroke" and a potential for TPA. Per MD her MRI results made her ineligible for TPA as being outside the window. She is not a candidate for IR therefore no acute treatment options.  Care Plan: q2x12 then q4 vitals/neuro checks. Hand off with Zoe RN.   "IMPRESSION: Acute infarct of the posterior left frontal lobe involving corona radiata extending to the cortex. Some involvement of lateral precentral gyrus.   More subacute appearing left occipital infarct. Few additional punctate acute or subacute left frontoparietal and right frontal infarcts.   Several chronic infarcts and moderate chronic microvascular ischemic changes."   Bernice Mullin, Rande Brunt, RN  Stroke Response Nurse

## 2021-02-20 NOTE — ED Notes (Signed)
Echo at bedside at this time

## 2021-02-20 NOTE — Progress Notes (Signed)
  Echocardiogram 2D Echocardiogram has been performed.  Kristin Briggs 02/20/2021, 5:09 PM

## 2021-02-20 NOTE — ED Provider Notes (Signed)
[facility] EMERGENCY DEPARTMENT Provider Note   CSN: [id number] Arrival date & time: 02/20/21  1313     History CC: Possible stroke  DOREATHA OFFER is a 87 y.o. female.  HPI   Pt presented as a code stroke activation.  Pt was noted to have right sided weakness and speech difficulty.  Last time known normal was last night.  Family noticed that the patient's speech was altered this morning.  They were concerned she was having a stroke so they called 911.  Past Medical History:  Diagnosis Date   Colon abnormality    Inflammed, Per [facility] New Patient Packet    There are no problems to display for this patient.   Past Surgical History:  Procedure Laterality Date   GALLBLADDER SURGERY  2004   Per [facility] New Patient Packet     OB History   No obstetric history on file.     Family History  Problem Relation Age of Onset   Leukemia Mother    Stroke Father    Heart attack Sister    Seizures Sister    Leukemia Sister    Stroke Brother    Ulcerative colitis Daughter     Social History   Tobacco Use   Smoking status: Never   Smokeless tobacco: Never  Vaping Use   Vaping Use: Never used  Substance Use Topics   Alcohol use: Not Currently   Drug use: Never    Home Medications Prior to Admission medications   Not on File    Allergies    Patient has no allergy information on record.  Review of Systems   Review of Systems  All other systems reviewed and are negative.  Physical Exam Updated Vital Signs Wt 58 kg   Physical Exam Vitals and nursing note reviewed.  Constitutional:      Appearance: She is well-developed. She is not diaphoretic.     Comments: Elderly, frail   HENT:     Head: Normocephalic and atraumatic.     Right Ear: External ear normal.     Left Ear: External ear normal.  Eyes:     General: No scleral icterus.       Right eye: No discharge.        Left eye: No discharge.     Conjunctiva/sclera: Conjunctivae normal.   Neck:     Trachea: No tracheal deviation.  Cardiovascular:     Rate and Rhythm: Normal rate and regular rhythm.  Pulmonary:     Effort: Pulmonary effort is normal. No respiratory distress.     Breath sounds: Normal breath sounds. No stridor. No wheezing or rales.  Abdominal:     General: Bowel sounds are normal. There is no distension.     Palpations: Abdomen is soft.     Tenderness: There is no abdominal tenderness. There is no guarding or rebound.  Musculoskeletal:        General: No tenderness or deformity.     Cervical back: Neck supple.  Skin:    General: Skin is warm and dry.     Findings: No rash.  Neurological:     Mental Status: She is alert.     Cranial Nerves: No cranial nerve deficit (no facial droop, extraocular movements intact,).     Sensory: No sensory deficit.     Motor: No abnormal muscle tone or seizure activity.     Coordination: Coordination normal.     Comments: Some repetitive speech, mild dysarthria, able  to name objects, follows commands, weakness, right side  Psychiatric:        Mood and Affect: Mood normal.    ED Results / Procedures / Treatments   Labs (all labs ordered are listed, but only abnormal results are displayed) Labs Reviewed  COMPREHENSIVE METABOLIC PANEL - Abnormal; Notable for the following components:      Result Value   Creatinine, Ser 1.25 (*)    Total Protein 6.1 (*)    GFR, Estimated 39 (*)    All other components within normal limits  I-STAT CHEM 8, ED - Abnormal; Notable for the following components:   Creatinine, Ser 1.20 (*)    All other components within normal limits  RESP PANEL BY RT-PCR (FLU A&B, COVID) ARPGX2  PROTIME-INR  APTT  CBC  DIFFERENTIAL  CBG MONITORING, ED    EKG None  Radiology MR BRAIN WO CONTRAST  Result Date: 02/20/2021 CLINICAL DATA:  Right-sided weakness and facial droop EXAM: MRI HEAD WITHOUT CONTRAST TECHNIQUE: Multiplanar, multiecho pulse sequences of the brain and surrounding structures  were obtained without intravenous contrast. COMPARISON:  Correlation made with same day CT FINDINGS: Brain: There is restricted diffusion in the posterior left frontal lobe involving the corona radiata extending to the cortex with some involvement of the lateral precentral gyrus. Area of reduced diffusion in the left occipital lobe with ADC isointensity relative to cortex. Few additional punctate foci of cortical diffusion hyperintensity in the left frontal and parietal lobes as well as right frontal lobe. Chronic bilateral parietooccipital infarcts with some corresponding chronic blood products. Chronic small vessel infarcts of the central white matter and subinsular white matter. Small chronic infarcts of the right cerebellum. Additional patchy and confluent areas of T2 hyperintensity in the supratentorial white matter are nonspecific but probably reflect moderate chronic microvascular ischemic changes. Few additional punctate foci of susceptibility hypointensity likely reflect chronic microhemorrhages. Prominence of the ventricles and sulci reflects generalized parenchymal volume loss. No significant mass effect.  No hydrocephalus. Vascular: Major vessel flow voids at the skull base are preserved. Skull and upper cervical spine: Normal marrow signal is preserved. Sinuses/Orbits: Paranasal sinuses are aerated. Orbits are unremarkable. Other: Sella is unremarkable.  Mastoid air cells are clear. IMPRESSION: Acute infarct of the posterior left frontal lobe involving corona radiata extending to the cortex. Some involvement of lateral precentral gyrus. More subacute appearing left occipital infarct. Few additional punctate acute or subacute left frontoparietal and right frontal infarcts. Several chronic infarcts and moderate chronic microvascular ischemic changes. Initial results were reviewed by telephone at the time of interpretation on 02/20/2021 at 1:51 pm with provider [facility] , who verbally acknowledged  these results. Electronically Signed   By: Guadlupe Spanish M.D.   On: 02/20/2021 14:00   CT HEAD CODE STROKE WO CONTRAST  Result Date: 02/20/2021 CLINICAL DATA:  Code stroke.  Right-sided weakness and facial droop EXAM: CT HEAD WITHOUT CONTRAST TECHNIQUE: Contiguous axial images were obtained from the base of the skull through the vertex without intravenous contrast. COMPARISON:  None. FINDINGS: Brain: No acute intracranial hemorrhage, mass effect, or edema. Likely a chronic infarcts of bilateral parietal and left occipital lobes. Additional patchy and confluent areas of low-attenuation in the supratentorial white matter nonspecific but may reflect moderate chronic microvascular ischemic changes. Prominence of the ventricles and sulci reflects generalized parenchymal volume loss. No extra-axial collection. Vascular: No hyperdense vessel. There is intracranial atherosclerotic calcification at the skull base. Skull: Unremarkable. Sinuses/Orbits: Mild right maxillary sinus mucosal thickening. No significant orbital  abnormality. Other: Mastoid air cells are clear. ASPECTS [facility] Stroke Program Early CT Score) - Ganglionic level infarction (caudate, lentiform nuclei, internal capsule, insula, M1-M3 cortex): - Supraganglionic infarction (M4-M6 cortex): Total score (0-10 with 10 being normal): IMPRESSION: There is no acute intracranial hemorrhage or evidence of acute infarction. ASPECT score is 10. 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 Moderate chronic microvascular ischemic changes. Electronically Signed   By: Guadlupe Spanish M.D.   On: 02/20/2021 13:34    Procedures Procedures   Medications Ordered in ED Medications  sodium chloride flush (NS) 0.9 % injection 3 mL (has no administration in time range)    ED Course  I have reviewed the triage vital signs and the nursing notes.  Pertinent labs & imaging results that were available during my care of the patient were reviewed by me and  considered in my medical decision making (see chart for details).    MDM Rules/Calculators/A&P                          Patient was sent to the ED with concerns of an acute stroke.  CT did not show any acute findings.  MRI was performed and patient does have evidence of stroke but does not meet criteria for further intervention at this time.  Patient was seen by the neurology stroke team including Dr. Derry Lory on arrival.  Laboratory test do not show any other acute abnormality.  CBC and metabolic panel are normal.  No signs of acute infection.  Her COVID test is in process.  I will consult the medical service for admission Final Clinical Impression(s) / ED Diagnoses Final diagnoses:  Cerebrovascular accident (CVA), unspecified mechanism (HCC)     Linwood Dibbles, MD 02/20/21 1423

## 2021-02-20 NOTE — ED Notes (Signed)
placed external cath patient is resting with call bell in reach

## 2021-02-20 NOTE — Consult Note (Signed)
NEUROLOGY CONSULTATION NOTE   Date of service: February 20, 2021 Patient Name: Kristin Briggs MRN:  [id number] DOB:  07-21-1920 Reason for consult: "Stroke code" Requesting Provider: Linwood Dibbles, MD _ _ _   _ __   _ __ _ _  __ __   _ __   __ _  History of Present Illness  Kristin Briggs is a 87 y.o. female with PMH significant for colon abrnormality who was last seen by family at her baseline shortly before she went to bed at 2330 on 02/19/21 and woke up at 1200 on 02/20/21 and tangled up in her walker and R arm seemed weak. Family immediately called EMS and she was brought in as a stroke code for some aphasia, R arm drift.  No prior history of strokes, no prior CNS surgery, no recent MI, no hx of ICH.  MRI was obtained under wake up protocol and demonstrated Acute posterior left frontal stroke and an acute/subacute stroke in the left occipital lobe with petechial hemorrhage on SWI.  mRS: 3(needs assistance with some ADLS including bath, but able to dress and undress, walks with a walker) LKW: 2330 on 02/19/21 tPA: not offered due to subacute L occipital stroke with mild petechial hemorrhage. Thrombectomy: not offered due to high mRS.  NIHSS components Score: Comment  1a Level of Conscious 0[x]  1[]  2[]  3[]      1b LOC Questions 0[]  1[]  2[x]       1c LOC Commands 0[x]  1[]  2[]       2 Best Gaze 0[x]  1[]  2[]       3 Visual 0[x]  1[]  2[]  3[]      4 Facial Palsy 0[]  1[x]  2[]  3[]      5a Motor Arm - left 0[x]  1[]  2[]  3[]  4[]  UN[]    5b Motor Arm - Right 0[x]  1[]  2[]  3[]  4[]  UN[]  No drift but weak hand grip  6a Motor Leg - Left 0[]  1[x]  2[]  3[]  4[]  UN[]    6b Motor Leg - Right 0[]  1[x]  2[]  3[]  4[]  UN[]    7 Limb Ataxia 0[]  1[x]  2[]  3[]  UN[]     8 Sensory 0[x]  1[]  2[]  UN[]      9 Best Language 0[]  1[x]  2[]  3[]      10 Dysarthria 0[x]  1[]  2[]  UN[]      11 Extinct. and Inattention 0[x]  1[]  2[]       TOTAL: 7    ROS  Unable to obtain due to aphasia.  Past History   Past Medical History:  Diagnosis Date    Colon abnormality    Inflammed, Per [facility] New Patient Packet   Past Surgical History:  Procedure Laterality Date   GALLBLADDER SURGERY  2004   Per [facility] New Patient Packet   Family History  Problem Relation Age of Onset   Leukemia Mother    Stroke Father    Heart attack Sister    Seizures Sister    Leukemia Sister    Stroke Brother    Ulcerative colitis Daughter    Social History   Socioeconomic History   Marital status: Widowed    Spouse name: Not on file   Number of children: Not on file   Years of education: Not on file   Highest education level: Not on file  Occupational History   Not on file  Tobacco Use   Smoking status: Never   Smokeless tobacco: Never  Vaping Use   Vaping Use: Never used  Substance and Sexual Activity   Alcohol use: Not Currently   Drug  use: Never   Sexual activity: Not on file  Other Topics Concern   Not on file  Social History Narrative   Per [facility] New Patient Packet Abstracted on 02/20/2021:      Diet: None      Caffeine: Coffee      Married, if yes what year: Widowed, married in 1944      Do you live in a house, apartment, assisted living, condo, trailer, ect: House      Is it one or more stories: One      How many persons live in your home? 4      Pets: No      Highest level or education completed: Jr.College      Current/Past profession: Forensic scientist       Exercise:    No              Type and how often:          Living Will: Yes   DNR:Yes   POA/HPOA:Yes      Functional Status:   Do you have difficulty bathing or dressing yourself? Yes and No   Do you have difficulty preparing food or eating? Yes and No   Do you have difficulty managing your medications? No   Do you have difficulty managing your finances? Yes   Do you have difficulty affording your medications? No   Social Determinants of Corporate investment banker Strain: Not on file  Food Insecurity: Not on file  Transportation Needs: Not on file  Physical  Activity: Not on file  Stress: Not on file  Social Connections: Not on file   Not on File  Medications  (Not in a hospital admission)    Vitals   Vitals:   02/20/21 1300  Weight: 58 kg     There is no height or weight on file to calculate BMI.  Physical Exam   General: Laying comfortably in bed; in no acute distress.  HENT: Normal oropharynx and mucosa. Normal external appearance of ears and nose.  Neck: Supple, no pain or tenderness  CV: No JVD. No peripheral edema.  Pulmonary: Symmetric Chest rise. Normal respiratory effort.  Abdomen: Soft to touch, non-tender.  Ext: No cyanosis, edema, or deformity  Skin: No rash. Normal palpation of skin.   Musculoskeletal: Normal digits and nails by inspection. No clubbing.   Neurologic Examination  Mental status/Cognition: Alert, oriented to self, place,but not to month and year, good attention.  Speech/language: Fluent, comprehension intact to simple commands, can name most but not all objects, repetition intact. Cranial nerves:   CN II Pupils equal and reactive to light, no VF deficits   CN III,IV,VI EOM intact, no gaze preference or deviation, no nystagmus   CN V normal sensation in V1, V2, and V3 segments bilaterally   CN VII Mild R facial droop   CN VIII normal hearing to speech   CN IX & X normal palatal elevation, no uvular deviation   CN XI    CN XII midline tongue protrusion   Motor:  Muscle bulk: poor, tone normal, pronator drift yes RUE. Mvmt Root Nerve  Muscle Right Left Comments  SA C5/6 Ax Deltoid     EF C5/6 Mc Biceps 4 5   EE C6/7/8 Rad Triceps 4 5   WF C6/7 Med FCR     WE C7/8 PIN ECU     F Ab C8/T1 U ADM/FDI 3 5   HF L1/2/3 Fem Illopsoas 4  4   KE L2/3/4 Fem Quad 4 4   DF L4/5 D Peron Tib Ant 4 4   PF S1/2 Tibial Grc/Sol 4 4    Reflexes:  Right Left Comments  Pectoralis      Biceps (C5/6) 1 1   Brachioradialis (C5/6) 1 1    Triceps (C6/7) 1 1    Patellar (L3/4) 1 1    Achilles (S1)      Hoffman       Plantar     Jaw jerk    Sensation:  Light touch intact   Pin prick    Temperature    Vibration   Proprioception    Coordination/Complex Motor:  - Finger to Nose intact on left, ataxia on right. - Heel to shin unabel to do - Rapid alternating movement are slowed. - Gait: Deferred.  Labs   CBC:  Recent Labs  Lab 02/20/21 1315 02/20/21 1323  WBC 6.2  --   NEUTROABS 4.1  --   HGB 13.2 13.3  HCT 41.1 39.0  MCV 96.3  --   PLT 175  --     Basic Metabolic Panel:  Lab Results  Component Value Date   NA 141 02/20/2021   K 4.1 02/20/2021   CO2 24 02/20/2021   GLUCOSE 97 02/20/2021   BUN 18 02/20/2021   CREATININE 1.20 (H) 02/20/2021   CALCIUM 9.2 02/20/2021   GFRNONAA 39 (L) 02/20/2021   Lipid Panel: No results found for: LDLCALC HgbA1c: No results found for: HGBA1C Urine Drug Screen: No results found for: LABOPIA, COCAINSCRNUR, LABBENZ, AMPHETMU, THCU, LABBARB  Alcohol Level No results found for: ETH  CT Head without contrast: CTH was negative for a large hypodensity concerning for a large territory infarct or hyperdensity concerning for an ICH  MRI Brain(personally reviewed): Acute infarct of the posterior left frontal lobe involving corona radiata extending to the cortex. Some involvement of lateral precentral gyrus.   More subacute appearing left occipital infarct. Few additional punctate acute or subacute left frontoparietal and right frontal infarcts.   Several chronic infarcts and moderate chronic microvascular ischemic changes.   Impression   Kristin Briggs is a 87 y.o. female with PMH significant for dementia, colon abrnormality who present with mild aphasia, RUE incoordination and weakness and mild dysarthria with a LKW of 2330 on 02/19/21.  MRI Brain demonstrated acute posterior left frontal lobe stroke along with a more subacute l occipital lobe infarct. No tPA offered due to the noted subacute infarct. Not a candidate for thrombectomy due to  poor functional baseline.  Primary Diagnosis:  Cerebral infarction due to embolism of  left middle cerebral artery.    Recommendations  Plan:  - Frequent Neuro checks per stroke unit protocol - Recommend Vascular imaging with MRA Angio Head without contrast and MR angio neck with contrast. - Recommend obtaining TTE - Recommend obtaining Lipid panel with LDL - Please start statin if LDL > 70 - Recommend HbA1c - Antithrombotic - aspirin 81mg  daily for now. Holding off on dual antiplatelet given advanced age and noted SWI changes in the left occitipal lobe. - Recommend DVT ppx - SBP goal - permissive hypertension first 24 h < 220/110. Held home meds.  - Recommend Telemetry monitoring for arrythmia - Recommend bedside swallow screen prior to PO intake. - Stroke education booklet - Recommend PT/OT/SLP consult  ______________________________________________________________________   Thank you for the opportunity to take part in the care of this patient. If you have any further questions, please  contact the neurology consultation attending.  Signed,  [place] Pager Number [phone] _ _ _   _ __   _ __ _ _  __ __   _ __   __ _

## 2021-02-21 ENCOUNTER — Encounter (HOSPITAL_COMMUNITY): Payer: Self-pay | Admitting: Family Medicine

## 2021-02-21 ENCOUNTER — Other Ambulatory Visit: Payer: Self-pay

## 2021-02-21 DIAGNOSIS — I959 Hypotension, unspecified: Secondary | ICD-10-CM | POA: Diagnosis not present

## 2021-02-21 DIAGNOSIS — R4701 Aphasia: Secondary | ICD-10-CM | POA: Diagnosis present

## 2021-02-21 DIAGNOSIS — Z8249 Family history of ischemic heart disease and other diseases of the circulatory system: Secondary | ICD-10-CM | POA: Diagnosis not present

## 2021-02-21 DIAGNOSIS — Z823 Family history of stroke: Secondary | ICD-10-CM | POA: Diagnosis not present

## 2021-02-21 DIAGNOSIS — I130 Hypertensive heart and chronic kidney disease with heart failure and stage 1 through stage 4 chronic kidney disease, or unspecified chronic kidney disease: Secondary | ICD-10-CM | POA: Diagnosis present

## 2021-02-21 DIAGNOSIS — I480 Paroxysmal atrial fibrillation: Secondary | ICD-10-CM | POA: Diagnosis not present

## 2021-02-21 DIAGNOSIS — Z806 Family history of leukemia: Secondary | ICD-10-CM | POA: Diagnosis not present

## 2021-02-21 DIAGNOSIS — Z20822 Contact with and (suspected) exposure to covid-19: Secondary | ICD-10-CM | POA: Diagnosis present

## 2021-02-21 DIAGNOSIS — I639 Cerebral infarction, unspecified: Secondary | ICD-10-CM | POA: Diagnosis present

## 2021-02-21 DIAGNOSIS — R471 Dysarthria and anarthria: Secondary | ICD-10-CM | POA: Diagnosis present

## 2021-02-21 DIAGNOSIS — R531 Weakness: Secondary | ICD-10-CM | POA: Diagnosis not present

## 2021-02-21 DIAGNOSIS — F0390 Unspecified dementia without behavioral disturbance: Secondary | ICD-10-CM | POA: Diagnosis present

## 2021-02-21 DIAGNOSIS — I5020 Unspecified systolic (congestive) heart failure: Secondary | ICD-10-CM | POA: Diagnosis not present

## 2021-02-21 DIAGNOSIS — I5022 Chronic systolic (congestive) heart failure: Secondary | ICD-10-CM | POA: Diagnosis present

## 2021-02-21 DIAGNOSIS — Z7189 Other specified counseling: Secondary | ICD-10-CM | POA: Diagnosis not present

## 2021-02-21 DIAGNOSIS — N1832 Chronic kidney disease, stage 3b: Secondary | ICD-10-CM | POA: Diagnosis present

## 2021-02-21 DIAGNOSIS — Z515 Encounter for palliative care: Secondary | ICD-10-CM | POA: Diagnosis not present

## 2021-02-21 DIAGNOSIS — Z66 Do not resuscitate: Secondary | ICD-10-CM | POA: Diagnosis present

## 2021-02-21 DIAGNOSIS — Z781 Physical restraint status: Secondary | ICD-10-CM | POA: Diagnosis not present

## 2021-02-21 DIAGNOSIS — G8191 Hemiplegia, unspecified affecting right dominant side: Secondary | ICD-10-CM | POA: Diagnosis present

## 2021-02-21 DIAGNOSIS — E785 Hyperlipidemia, unspecified: Secondary | ICD-10-CM | POA: Diagnosis present

## 2021-02-21 DIAGNOSIS — I63412 Cerebral infarction due to embolism of left middle cerebral artery: Secondary | ICD-10-CM | POA: Diagnosis present

## 2021-02-21 LAB — LIPID PANEL
Cholesterol: 239 mg/dL — ABNORMAL HIGH (ref 0–200)
HDL: 57 mg/dL (ref >40)
LDL Cholesterol: 157 mg/dL — ABNORMAL HIGH (ref 0–99)
Total CHOL/HDL Ratio: 4.2 RATIO
Triglycerides: 123 mg/dL (ref <150)
VLDL: 25 mg/dL (ref 0–40)

## 2021-02-21 LAB — CBC
HCT: 41.7 % (ref 36.0–46.0)
Hemoglobin: 13 g/dL (ref 12.0–15.0)
MCH: 29.7 pg (ref 26.0–34.0)
MCHC: 31.2 g/dL (ref 30.0–36.0)
MCV: 95.4 fL (ref 80.0–100.0)
Platelets: 197 10*3/uL (ref 150–400)
RBC: 4.37 MIL/uL (ref 3.87–5.11)
RDW: 13 % (ref 11.5–15.5)
WBC: 7.9 10*3/uL (ref 4.0–10.5)
nRBC: 0 % (ref 0.0–0.2)

## 2021-02-21 LAB — BASIC METABOLIC PANEL
Anion gap: 11 (ref 5–15)
BUN: 13 mg/dL (ref 8–23)
CO2: 24 mmol/L (ref 22–32)
Calcium: 9.2 mg/dL (ref 8.9–10.3)
Chloride: 106 mmol/L (ref 98–111)
Creatinine, Ser: 1.04 mg/dL — ABNORMAL HIGH (ref 0.44–1.00)
GFR, Estimated: 49 mL/min — ABNORMAL LOW (ref >60)
Glucose, Bld: 74 mg/dL (ref 70–99)
Potassium: 3.9 mmol/L (ref 3.5–5.1)
Sodium: 141 mmol/L (ref 135–145)

## 2021-02-21 LAB — TSH: TSH: 2.686 u[IU]/mL (ref 0.350–4.500)

## 2021-02-21 LAB — HEMOGLOBIN A1C
Hgb A1c MFr Bld: 5.7 % — ABNORMAL HIGH (ref 4.8–5.6)
Mean Plasma Glucose: 116.89 mg/dL

## 2021-02-21 MED ORDER — ENOXAPARIN SODIUM 30 MG/0.3ML IJ SOSY
30.0000 mg | PREFILLED_SYRINGE | INTRAMUSCULAR | Status: DC
  Administered 2021-02-21 – 2021-02-28 (×8): 30 mg via SUBCUTANEOUS
  Filled 2021-02-21 (×8): qty 0.3

## 2021-02-21 MED ORDER — ATORVASTATIN CALCIUM 40 MG PO TABS
40.0000 mg | ORAL_TABLET | Freq: Every day | ORAL | Status: DC
  Administered 2021-02-21 – 2021-02-28 (×6): 40 mg via ORAL
  Filled 2021-02-21 (×5): qty 1

## 2021-02-21 NOTE — Plan of Care (Signed)
Pt arrived from ED via stretcher accompanied by ED tech and daughter. Pt and family member was introduced to room and to this RN and NT and showed pt and family member how to use call bell to call for assistance and how to use the TV. Cleaned and bathed pt and skin assessment was done w/ Joni Reining, Consulting civil engineer. Pt was placed on telemetry and verified. Pt is currently AxO 2 with confusion towards situation and time and NIHSS was done and charted in flowsheets accordingly. Pt and family member had no further questions or concerns at the moment. Will continue to monitor.    Problem: Education: Goal: Knowledge of General Education information will improve Description: Including pain rating scale, medication(s)/side effects and non-pharmacologic comfort measures Outcome: Progressing   Problem: Health Behavior/Discharge Planning: Goal: Ability to manage health-related needs will improve Outcome: Progressing   Problem: Clinical Measurements: Goal: Diagnostic test results will improve Outcome: Progressing   Problem: Safety: Goal: Ability to remain free from injury will improve Outcome: Progressing   Problem: Education: Goal: Knowledge of disease or condition will improve Outcome: Progressing   Problem: Health Behavior/Discharge Planning: Goal: Ability to manage health-related needs will improve Outcome: Progressing

## 2021-02-21 NOTE — ED Notes (Signed)
At the time of vitals being taken a few minutes ago, PT's O2 Saturation was bouncing back and forth between the mid 80s-low 90s with 93 % being the highest it went. PT was placed on 2LPM [place] and her O2 saturation immediately went up to 97. It is currently at 100. Will continue to monitor her vitals

## 2021-02-21 NOTE — Evaluation (Signed)
Physical Therapy Evaluation Patient Details Name: Kristin Briggs MRN: [id number] DOB: [birth year] Today's Date: 02/21/2021   History of Present Illness  Pt is a 87 y.o. female admitted 02/20/21 as code stroke with dysarthria, LKW time on 6/21 at 2330. MRI showed acute infarct in posterior L frontal lobe; subacute L occipital infarct; punctate acute/subacute L frontoparietal and R frontal infarcts; several chronic infarcts, microvascular ischemic changes. PMH includes CKD, dementia.   Clinical Impression  Pt presents with an overall decrease in functional mobility secondary to above. Pt confused, disoriented and poor historian; daughter present and reports that PTA, pt ambulatory with RW, pt with h/o dementia and lives with family who assists with ADLs/iADLs as needed. Today, pt requiring mod-maxA for bed mobility and standing trials. Pt limited by generalized weakness, R-side coordination deficits, poor balance strategies and cognitive impairments. Daughter reports that pt with significantly increased confusion and disorientation compared to baseline. Feel pt may benefit from intensive CIR-level therapies to maximize functional mobility and independence, as well as decrease caregiver burden, prior to return home. Will follow acutely to address established goals.    Follow Up Recommendations CIR;Supervision/Assistance - 24 hour    Equipment Recommendations   (TBD)    Recommendations for Other Services       Precautions / Restrictions Precautions Precautions: Fall Restrictions Weight Bearing Restrictions: No      Mobility  Bed Mobility Overal bed mobility: Needs Assistance Bed Mobility: Supine to Sit;Sit to Supine     Supine to sit: Mod assist Sit to supine: Mod assist   General bed mobility comments: Pt requires simple, repeated commands for sequencing movement, min-modA for trunk elevation and scooting hips to EOB; pt with decreased initiation for return to supine, ultimately  requiring modA to initiate movement; once supine, pt able to assist well with BLEs to scoot up in bed    Transfers Overall transfer level: Needs assistance Equipment used: Rolling walker (2 wheeled);1 person hand held assist;None Transfers: Sit to/from Stand Sit to Stand: Mod assist;Max assist         General transfer comment: Pt with difficulty achieving fully upright standing with RW despite maxA due to inability to reach RUE fully to handle and unable to grip; additional trial with RUE HHA, able to stand with modA for trunk elevation and stability, pt anxious regarding standing trials but less so with support from R-side, pt reaching for object to hold onto with LUE  Ambulation/Gait             General Gait Details: Unsafe to attempt without +2 assist this session  Stairs            Wheelchair Mobility    Modified Rankin (Stroke Patients Only) Modified Rankin (Stroke Patients Only) Pre-Morbid Rankin Score: Moderate disability Modified Rankin: Severe disability     Balance Overall balance assessment: Needs assistance Sitting-balance support: No upper extremity supported;Feet supported Sitting balance-Leahy Scale: Fair Sitting balance - Comments: Able to maintain static sitting without UE support when cued to sit still, at times requiring min-modA to maintain balance due to leaning forward attempting to stand without cues     Standing balance-Leahy Scale: Zero                               Pertinent Vitals/Pain Pain Assessment: No/denies pain    Home Living Family/patient expects to be discharged to:: Private residence Living Arrangements: Children (Daughter) Available Help at Discharge: Family Type  of Home: House         Home Equipment: Dan Humphreys - 2 wheels;Other (comment) Additional Comments: Per family, daughter-in-law assists with showering in tub shower with use of camping toilet for pt to sit on    Prior Function Level of Independence:  Needs assistance   Gait / Transfers Assistance Needed: Ambulatory with walker and supervision from family  ADL's / Homemaking Assistance Needed: Requires assist for majority of ADLs; daughter-in-law assists with bathing; pt able to prep snacks (i.e. cracker and peanut butter); requires assist for iADLs  Comments: Pt used to be very active - swimming, bowling, square dancing, ballroom dancing...     Hand Dominance        Extremity/Trunk Assessment   Upper Extremity Assessment Upper Extremity Assessment: RUE deficits/detail;LUE deficits/detail;Difficult to assess due to impaired cognition RUE Deficits / Details: Functionally <3/5 throughout with observed coordination deficits; unable to grip RW; apparent dysmetria/disccordination, but not formally tested RUE Coordination: decreased gross motor;decreased fine motor LUE Deficits / Details: Chronic L shoulder "arthritis" (per family) with shoulder flex/abd AROM limited to <90' LUE Coordination: decreased gross motor    Lower Extremity Assessment Lower Extremity Assessment: RLE deficits/detail;LLE deficits/detail;Difficult to assess due to impaired cognition RLE Deficits / Details: Functionally at least 3/5 throughout, does not appear to have decrease coordination, but difficult to determine this session LLE Deficits / Details: Functionally at least 3/5 throughout    Cervical / Trunk Assessment Cervical / Trunk Assessment: Kyphotic  Communication   Communication: Expressive difficulties  Cognition Arousal/Alertness: Awake/alert Behavior During Therapy: Anxious Overall Cognitive Status: Impaired/Different from baseline Area of Impairment: Orientation;Attention;Memory;Following commands;Safety/judgement;Awareness;Problem solving                 Orientation Level: Disoriented to;Place;Time;Situation Current Attention Level: Focused;Sustained Memory: Decreased short-term memory Following Commands: Follows one step commands  inconsistently Safety/Judgement: Decreased awareness of safety;Decreased awareness of deficits Awareness: Intellectual Problem Solving: Slow processing;Decreased initiation;Difficulty sequencing;Requires verbal cues;Requires tactile cues General Comments: H/o dementia. Daughter present and reports cognition worse than baseline - pt much more confused and anxious, requires frequent redirection and reorientation. Pt does recognize family members, but forgets visitors are in room then suprised to see them. Initially seemed like some inattention, but unsure that's the case. Pt easily distracted, but redirectable with simple, short commands, often requires repetition      General Comments General comments (skin integrity, edema, etc.): Family present and supportive    Exercises     Assessment/Plan    PT Assessment Patient needs continued PT services  PT Problem List Decreased strength;Decreased range of motion;Decreased activity tolerance;Decreased balance;Decreased mobility;Decreased coordination;Decreased cognition;Decreased knowledge of use of DME;Decreased safety awareness       PT Treatment Interventions DME instruction;Gait training;Stair training;Functional mobility training;Therapeutic activities;Therapeutic exercise;Balance training;Neuromuscular re-education;Cognitive remediation;Patient/family education    PT Goals (Current goals can be found in the Care Plan section)  Acute Rehab PT Goals Patient Stated Goal: Happy to get out of bed PT Goal Formulation: With patient Time For Goal Achievement: 03/07/21 Potential to Achieve Goals: Fair    Frequency Min 4X/week   Barriers to discharge        Co-evaluation               AM-PAC PT "6 Clicks" Mobility  Outcome Measure Help needed turning from your back to your side while in a flat bed without using bedrails?: A Lot Help needed moving from lying on your back to sitting on the side of a flat bed without using bedrails?:  A  Lot Help needed moving to and from a bed to a chair (including a wheelchair)?: A Lot Help needed standing up from a chair using your arms (e.g., wheelchair or bedside chair)?: A Lot Help needed to walk in hospital room?: A Lot Help needed climbing 3-5 steps with a railing? : Total 6 Click Score: 11    End of Session Equipment Utilized During Treatment: Gait belt Activity Tolerance: Patient tolerated treatment well Patient left: in bed;with call bell/phone within reach;with bed alarm set;with family/visitor present (bed in chair position) Nurse Communication: Mobility status PT Visit Diagnosis: Other abnormalities of gait and mobility (R26.89);Other symptoms and signs involving the nervous system (R29.898);Muscle weakness (generalized) (M62.81)    Time: 2751-7001 PT Time Calculation (min) (ACUTE ONLY): 24 min   Charges:   PT Evaluation $PT Eval Moderate Complexity: 1 Mod PT Treatments $Therapeutic Activity: 8-22 mins      Ina Homes, PT, DPT Acute Rehabilitation Services  Pager [phone] Office [phone]  Malachy Chamber 02/21/2021, 5:34 PM

## 2021-02-21 NOTE — Evaluation (Signed)
Occupational Therapy Evaluation Patient Details Name: Kristin Briggs MRN: [id number] DOB: 1920/01/02 Today's Date: 02/21/2021    History of Present Illness Pt is a 87 yr old female who presents to the ED as they right facial droop, aphasia, right sided weakness.  Per chart: pt found to have "MR brain: acute infarct in posterior left frontal lobe involving corona radiata extending to the cortex; more subacute appearing left occipital infarct; few additional punctate acute or subacute left frontoparietal and right frontal infarcts; several chronic infarcts and microvascular ischemic changes" Pt PMH of dementia   Clinical Impression   Pt admitted  due to signs of stroke. At [facility] pt needed assist with IADLS and some assistance with ADLS with use of walker. Pt at this time requires moderate assist, supine to sitting moderate x2, sit to stand from elevated surface mod x2 from elevated surface. Pt noted decrease in postural control as they would attempt to lean forward or to R side. Pt has decrease in corrdination and motor control of RUE.  Pt currently with functional limitations due to the deficits listed below (see OT Problem List).  Pt will benefit from skilled acute OT to increase their safety and independence with ADL and functional mobility for ADL to facilitate discharge to venue listed below.      Follow Up Recommendations  SNF;Supervision/Assistance - 24 hour    Equipment Recommendations       Recommendations for Other Services       Precautions / Restrictions Precautions Precautions: Other (comment) Precaution Comments: decrease in motor control of R hand Restrictions Weight Bearing Restrictions: No      Mobility Bed Mobility Overal bed mobility: Needs Assistance Bed Mobility: Rolling;Supine to Sit;Sit to Supine Rolling: Mod assist (as pt fatgiues required max x2)   Supine to sit: Mod assist;+2 for physical assistance;+2 for safety/equipment Sit to supine: Mod assist;+2 for  physical assistance;+2 for safety/equipment   General bed mobility comments: Pt requires max cues due to decrease control of RUE    Transfers Overall transfer level: Needs assistance Equipment used: Rolling walker (2 wheeled) Transfers: Sit to/from Stand Sit to Stand: Mod assist;+2 physical assistance;+2 safety/equipment;From elevated surface         General transfer comment: Pt was able to sit to stand but required to rest for less then 1 min of standing    Balance                                           ADL either performed or assessed with clinical judgement   ADL Overall ADL's : Needs assistance/impaired Eating/Feeding: Minimal assistance;Bed level;Cueing for safety;Cueing for sequencing   Grooming: Moderate assistance;Cueing for safety;Cueing for sequencing;Bed level   Upper Body Bathing: Moderate assistance;Cueing for safety;Cueing for sequencing;Bed level   Lower Body Bathing: Maximal assistance;Bed level   Upper Body Dressing : Moderate assistance;Bed level   Lower Body Dressing: Maximal assistance;Bed level                 General ADL Comments: unable to complete transfer in session     Vision Baseline Vision/History: Wears glasses Vision Assessment?:  (daughter has noted no changes and non noted in session but also hard to determine)     Perception     Praxis      Pertinent Vitals/Pain Pain Assessment: Faces Faces Pain Scale: Hurts a little bit Pain Location: noted  with peri care but also could be they do not like having oter people assist with cleaning     Hand Dominance Right   Extremity/Trunk Assessment Upper Extremity Assessment Upper Extremity Assessment: RUE deficits/detail RUE Deficits / Details: decrease in motor control at wrist and distaly RUE Sensation:  (none noted at this time but to monitor) RUE Coordination: decreased fine motor;decreased gross motor   Lower Extremity Assessment Lower Extremity  Assessment: Defer to PT evaluation   Cervical / Trunk Assessment Cervical / Trunk Assessment: Kyphotic   Communication Communication Communication: Expressive difficulties   Cognition Arousal/Alertness: Awake/alert Behavior During Therapy: Anxious Overall Cognitive Status: Impaired/Different from baseline Area of Impairment: Safety/judgement;Awareness;Problem solving;Following commands;Memory;Attention;Orientation                 Orientation Level: Person     Following Commands: Follows one step commands inconsistently Safety/Judgement: Decreased awareness of safety;Decreased awareness of deficits   Problem Solving: Slow processing;Decreased initiation;Difficulty sequencing;Requires verbal cues;Requires tactile cues General Comments: Pt requires some cognition assist at prior level but now has decreased as noted they would ask where is my bed?   General Comments       Exercises Exercises: General Upper Extremity General Exercises - Upper Extremity Wrist Flexion: PROM;Right Wrist Extension: PROM;Right Digit Composite Flexion: PROM;Right Composite Extension: PROM;Right   Shoulder Instructions      Home Living Family/patient expects to be discharged to:: Private residence Living Arrangements: Children;Other relatives Available Help at Discharge: Family Type of Home: House             Bathroom Shower/Tub: Tub/shower unit;Other (comment) (reported they have a walk in shower but unable to use as daughter in law requires to give a shower)   Bathroom Toilet: Standard     Home Equipment: Environmental consultant - 2 wheels;Other (comment) (daughter reported they use a camping toilet to be able shower pt)      Lives With: Family (daughter [name] and then daughter in law assists)    Prior Functioning/Environment Level of Independence: Needs assistance  Gait / Transfers Assistance Needed: walker ADL's / Homemaking Assistance Needed: needed assist with showers and home  making Communication / Swallowing Assistance Needed: hx dementia          OT Problem List: Decreased strength;Decreased range of motion;Decreased activity tolerance;Impaired balance (sitting and/or standing);Decreased safety awareness;Decreased knowledge of use of DME or AE      OT Treatment/Interventions: Self-care/ADL training;Therapeutic exercise;Neuromuscular education;Energy conservation;DME and/or AE instruction;Therapeutic activities;Patient/family education;Balance training    OT Goals(Current goals can be found in the care plan section) Acute Rehab OT Goals Patient Stated Goal: To be able to rest OT Goal Formulation: With patient Time For Goal Achievement: 03/02/21 Potential to Achieve Goals: Good  OT Frequency: Min 2X/week   Barriers to D/C:            Co-evaluation              AM-PAC OT "6 Clicks" Daily Activity     Outcome Measure Help from another person eating meals?: A Little Help from another person taking care of personal grooming?: A Lot Help from another person toileting, which includes using toliet, bedpan, or urinal?: A Lot Help from another person bathing (including washing, rinsing, drying)?: A Lot Help from another person to put on and taking off regular upper body clothing?: A Lot Help from another person to put on and taking off regular lower body clothing?: A Lot 6 Click Score: 13   End of Session Equipment Utilized During  Treatment: Gait belt;Rolling walker Nurse Communication: Mobility status  Activity Tolerance: Patient limited by fatigue Patient left: in bed;with call bell/phone within reach;with bed alarm set;with family/visitor present  OT Visit Diagnosis: Unsteadiness on feet (R26.81);Other abnormalities of gait and mobility (R26.89);Muscle weakness (generalized) (M62.81);Feeding difficulties (R63.3)                Time: 9323-5573 OT Time Calculation (min): 54 min Charges:  OT General Charges $OT Visit: 1 Visit OT Evaluation $OT  Eval Low Complexity: 1 Low OT Treatments $Self Care/Home Management : 38-52 mins  Alphia Moh OTR/L  Acute Rehab Services  [phone] office number [phone] pager number   Alphia Moh 02/21/2021, 11:35 AM

## 2021-02-21 NOTE — Evaluation (Signed)
Speech Language Pathology Evaluation Patient Details Name: Kristin Briggs MRN: [id number] DOB: 11-15-1919 Today's Date: 02/21/2021 Time: 0915-1000 SLP Time Calculation (min) (ACUTE ONLY): 45 min  Problem List:  Patient Active Problem List   Diagnosis Date Noted   Acute ischemic stroke (HCC) 02/20/2021   Past Medical History:  Past Medical History:  Diagnosis Date   Colon abnormality    Inflammed, Per [facility] New Patient Packet   Past Surgical History:  Past Surgical History:  Procedure Laterality Date   GALLBLADDER SURGERY  2004   Per [facility] New Patient Packet   HPI:  pt is a 87 yo female adm to [facility] with right facial droop, aphasia, right sided weakness.  Per imaging, pt with "posterior left frontal lobe involving corona  radiata extending to the cortex. Some involvement of lateral  precentral gyrus.     More subacute appearing left occipital infarct. Few additional punctate acute or subacute left frontoparietal and right frontal infarcts. Several chronic infarcts and moderate chronic microvascular ischemic changes." Pt also has h/o cogntitive deficit diagnosed in 2005 but not worked up further per daughter.  Pt resides with her daughter and granddaughter and was fluent prior to admission. She reports she enjoys sitting on the porch and watching cars and people.   Assessment / Plan / Recommendation Clinical Impression  Patients presents with dysarthria, motor planning deficits with expressive language impairment with some baseline cognitive deficits.   Strengths include reading, pragmatics and comprehension of language - although pt is HOH.    Her deficits result in both imprecise articulation and inconsistent errors/groping.  Automatic speech tasks are easier for pt to perform but still not fully 100% fluent for lengthier expression.  Sentence and phrase completion tasks were very accurate and faciliated pt's expressive language.  Pt with difficulty naming pictures of animals but was able to  select correct name from written choice of 3.    Given pt's receptive language is excellent, her potential for effective use of compensation strategies is good with family support.    With moderate verbal cues to slow rate of speech and overarticulate with repetition, pt improves her intelligibility to nearly 100% with single words and short phrases.    Recommend skilled SLP follow up to help maximize her expressive speech/language. Educated pt and daughter to findings and compensation strategies to help facilitate pt's expressive language/speech.    SLP Assessment  SLP Recommendation/Assessment: Patient needs continued Speech Lanaguage Pathology Services SLP Visit Diagnosis: Apraxia (R48.2);Aphasia (R47.01);Dysarthria and anarthria (R47.1)    Follow Up Recommendations  Home health SLP    Frequency and Duration min 2x/week  1 week      SLP Evaluation Cognition  Arousal/Alertness: Awake/alert Orientation Level: Oriented to person;Oriented to place;Oriented to situation (pt does not stay oriented to time at home, asks dates frequently per daughter) Memory:  (impaired at baseline due to cognitive deficits) Awareness: Impaired Awareness Impairment: Intellectual impairment (decreased awareness to deficits including using right hand, speech, etc) Problem Solving: Impaired Problem Solving Impairment: Functional basic (benefits from SLP driven assistance to put glasses on)       Comprehension  Auditory Comprehension Overall Auditory Comprehension: Impaired Yes/No Questions: Not tested Commands: Impaired One Step Basic Commands: 75-100% accurate Two Step Basic Commands: 75-100% accurate Multistep Basic Commands: 50-74% accurate Conversation: Complex Interfering Components: Hearing;Motor planning EffectiveTechniques: Increased volume Reading Comprehension Reading Status:  (pt able to read and match single words to pictures from written choice of 3, able to read aloud)    Expression  Expression Primary Mode of Expression: Verbal Verbal Expression Overall Verbal Expression: Impaired Initiation: No impairment Automatic Speech: Counting;Month of year;Singing (impaired articulation and paraphasias) Level of Generative/Spontaneous Verbalization: Sentence Repetition: Impaired Level of Impairment: Phrase level;Sentence level Naming: Impairment Responsive: 51-75% accurate (completing functional sentences 4/5 and automatic phrases 7/7 correct without cue) Confrontation: Impaired Verbal Errors: Phonemic paraphasias;Other (comment) (inconsisently aware of errors, suspect more behavioral in attempts to relay verbal communication) Pragmatics: No impairment Interfering Components: Speech intelligibility Effective Techniques: Sentence completion;Written cues;Melodic intonation Non-Verbal Means of Communication: Not applicable Written Expression Dominant Hand: Right Written Expression: Not tested (pt with significant right hand weakness- does not open her right hand)   Oral / Motor  Oral Motor/Sensory Function Overall Oral Motor/Sensory Function: Mild impairment Facial ROM: Reduced right Facial Symmetry: Within Functional Limits;Other (Comment) (? appearance of mild edema on right side of face, ? if pt slept on her right side) Facial Strength: Reduced right;Other (Comment) Facial Sensation: Within Functional Limits Lingual ROM: Other (Comment) (reduced precision) Lingual Symmetry: Within Functional Limits Lingual Strength: Reduced Velum: Within Functional Limits Mandible: Other (Comment) (DNT) Motor Speech Overall Motor Speech: Impaired Respiration: Impaired Level of Impairment: Word Phonation: Hoarse Resonance: Within functional limits Articulation: Impaired Level of Impairment: Word Intelligibility: Intelligibility reduced Word: 75-100% accurate Phrase: 50-74% accurate Sentence: 25-49% accurate Conversation: Not tested Motor Planning: Impaired Motor Speech Errors:  Groping for words;Inconsistent;Unaware (inconsistently aware of errors) Effective Techniques: Slow rate;Over-articulate;Pacing   GO                    Kristin Briggs 02/21/2021, 11:10 AM  Rolena Infante, MS [facility] SLP Acute Rehab Services Office [phone] Pager [phone]

## 2021-02-21 NOTE — Progress Notes (Addendum)
STROKE TEAM PROGRESS NOTE   INTERVAL HISTORY Her daughter (and caregiver at home) is at the bedside.  Confirmed DNR/DNI. Pt remains globally aphasic. Discussed MRI and Echo findings.   Vitals:   02/21/21 0504 02/21/21 0723 02/21/21 1147 02/21/21 1542  BP: (!) 151/66 136/77 137/70 134/66  Pulse: 73 84 83 84  Resp: 16 20 19 15   Temp: 97.8 F (36.6 C) 98.1 F (36.7 C) 98.5 F (36.9 C) 97.6 F (36.4 C)  TempSrc: Oral Axillary Axillary Axillary  SpO2: 95% 96% 93% 94%  Weight: 56 kg     Height: 5' (1.524 m)      CBC:  Recent Labs  Lab 02/20/21 1315 02/20/21 1323 02/21/21 1010  WBC 6.2  --  7.9  NEUTROABS 4.1  --   --   HGB 13.2 13.3 13.0  HCT 41.1 39.0 41.7  MCV 96.3  --  95.4  PLT 175  --  197   Basic Metabolic Panel:  Recent Labs  Lab 02/20/21 1315 02/20/21 1323 02/21/21 1010  NA 140 141 141  K 4.0 4.1 3.9  CL 107 108 106  CO2 24  --  24  GLUCOSE 98 97 74  BUN 14 18 13   CREATININE 1.25* 1.20* 1.04*  CALCIUM 9.2  --  9.2   Lipid Panel:  Recent Labs  Lab 02/21/21 0451  CHOL 239*  TRIG 123  HDL 57  CHOLHDL 4.2  VLDL 25  LDLCALC 960157*   HgbA1c:  Recent Labs  Lab 02/21/21 0451  HGBA1C 5.7*   Urine Drug Screen: No results for input(s): LABOPIA, COCAINSCRNUR, LABBENZ, AMPHETMU, THCU, LABBARB in the last 168 hours.  Alcohol Level No results for input(s): ETH in the last 168 hours.  IMAGING past 24 hours MR ANGIO HEAD WO CONTRAST  Result Date: 02/20/2021 CLINICAL DATA:  Initial evaluation for acute stroke. EXAM: MRA HEAD WITHOUT CONTRAST MRA NECK WITHOUT AND WITH CONTRAST TECHNIQUE: Angiographic images of the Circle of Willis were obtained using MRA technique without intravenous contrast. Angiographic images of the neck were obtained using MRA technique without and with intravenous contrast. Carotid stenosis measurements (when applicable) are obtained utilizing NASCET criteria, using the distal internal carotid diameter as the denominator. CONTRAST:  5mL  GADAVIST GADOBUTROL 1 MMOL/ML IV SOLN COMPARISON:  Previous CT and MRI from earlier the same day. FINDINGS: MRA HEAD FINDINGS ANTERIOR CIRCULATION: Visualized distal cervical segments of the internal carotid arteries are patent with antegrade flow. Petrous, cavernous, and supraclinoid segments patent without stenosis. 3 mm focal outpouching at the expected takeoff of the right posterior communicating artery is seen (series 7, image 97), favored to reflect a small vascular infundibulum, although a small aneurysm is difficult to exclude. A1 segments patent bilaterally. Normal anterior communicating artery complex. Anterior cerebral arteries mildly irregular but are patent to their distal aspects without high-grade stenosis. M1 segments patent bilaterally without significant stenosis. Normal MCA bifurcations. Distal MCA branches well perfused and symmetric, although demonstrate diffuse small vessel atheromatous irregularity. POSTERIOR CIRCULATION: Both vertebral arteries patent to the vertebrobasilar junction without stenosis. Left vertebral artery slightly dominant. Right PICA origin patent and normal. Left PICA not definitely seen. Basilar widely patent to its distal aspect without stenosis. Superior cerebellar arteries patent bilaterally. Scattered atheromatous irregularity seen within the PCAs without high-grade stenosis. PCAs remain well perfused to their distal aspects. A small left posterior communicating artery noted. MRA NECK FINDINGS AORTIC ARCH: Visualized aortic arch normal in caliber with normal 3 vessel morphology. No hemodynamically significant stenosis seen about  the origin of the great vessels. RIGHT CAROTID SYSTEM: Right CCA mildly tortuous but is patent to the bifurcation without stenosis. No significant atheromatous narrowing or irregularity about the right carotid bulb or proximal right ICA. Right ICA tortuous but widely patent to the skull base without stenosis, evidence for dissection or  occlusion. LEFT CAROTID SYSTEM: Left CCA mildly tortuous but is widely patent to the bifurcation. Eccentric plaque at the left carotid bulb/proximal left ICA with associated stenosis of up to 60% by NASCET criteria (series 1065, image 7). Left ICA tortuous but is widely patent distally without stenosis, evidence for dissection, or occlusion. VERTEBRAL ARTERIES: Both vertebral arteries arise from the subclavian arteries. No proximal subclavian artery stenosis. Vertebral arteries somewhat tortuous but are widely patent without stenosis, evidence for dissection or occlusion. IMPRESSION: MRA HEAD IMPRESSION: 1. Negative intracranial MRA for large vessel occlusion. 2. Diffuse intracranial atherosclerotic disease, overall mild for age. No hemodynamically significant or correctable stenosis. 3. 3 mm focal outpouching arising from the supraclinoid right ICA. While this is favored to reflect a vascular infundibulum, a small aneurysm is difficult to exclude. Attention at follow-up recommended. MRA NECK IMPRESSION: 1. 60% atheromatous stenosis at the left carotid bulb/proximal left ICA. 2. No hemodynamically significant stenosis about the right carotid artery system. 3. Wide patency of the vertebral arteries within the neck. 4. Diffuse tortuosity the major arterial vasculature of the neck, suggesting chronic underlying hypertension. Electronically Signed   By: Rise Mu M.D.   On: 02/20/2021 19:37   MR ANGIO NECK W WO CONTRAST  Result Date: 02/20/2021 CLINICAL DATA:  Initial evaluation for acute stroke. EXAM: MRA HEAD WITHOUT CONTRAST MRA NECK WITHOUT AND WITH CONTRAST TECHNIQUE: Angiographic images of the Circle of Willis were obtained using MRA technique without intravenous contrast. Angiographic images of the neck were obtained using MRA technique without and with intravenous contrast. Carotid stenosis measurements (when applicable) are obtained utilizing NASCET criteria, using the distal internal carotid  diameter as the denominator. CONTRAST:  46mL GADAVIST GADOBUTROL 1 MMOL/ML IV SOLN COMPARISON:  Previous CT and MRI from earlier the same day. FINDINGS: MRA HEAD FINDINGS ANTERIOR CIRCULATION: Visualized distal cervical segments of the internal carotid arteries are patent with antegrade flow. Petrous, cavernous, and supraclinoid segments patent without stenosis. 3 mm focal outpouching at the expected takeoff of the right posterior communicating artery is seen (series 7, image 97), favored to reflect a small vascular infundibulum, although a small aneurysm is difficult to exclude. A1 segments patent bilaterally. Normal anterior communicating artery complex. Anterior cerebral arteries mildly irregular but are patent to their distal aspects without high-grade stenosis. M1 segments patent bilaterally without significant stenosis. Normal MCA bifurcations. Distal MCA branches well perfused and symmetric, although demonstrate diffuse small vessel atheromatous irregularity. POSTERIOR CIRCULATION: Both vertebral arteries patent to the vertebrobasilar junction without stenosis. Left vertebral artery slightly dominant. Right PICA origin patent and normal. Left PICA not definitely seen. Basilar widely patent to its distal aspect without stenosis. Superior cerebellar arteries patent bilaterally. Scattered atheromatous irregularity seen within the PCAs without high-grade stenosis. PCAs remain well perfused to their distal aspects. A small left posterior communicating artery noted. MRA NECK FINDINGS AORTIC ARCH: Visualized aortic arch normal in caliber with normal 3 vessel morphology. No hemodynamically significant stenosis seen about the origin of the great vessels. RIGHT CAROTID SYSTEM: Right CCA mildly tortuous but is patent to the bifurcation without stenosis. No significant atheromatous narrowing or irregularity about the right carotid bulb or proximal right ICA. Right ICA tortuous but widely  patent to the skull base without  stenosis, evidence for dissection or occlusion. LEFT CAROTID SYSTEM: Left CCA mildly tortuous but is widely patent to the bifurcation. Eccentric plaque at the left carotid bulb/proximal left ICA with associated stenosis of up to 60% by NASCET criteria (series 1065, image 7). Left ICA tortuous but is widely patent distally without stenosis, evidence for dissection, or occlusion. VERTEBRAL ARTERIES: Both vertebral arteries arise from the subclavian arteries. No proximal subclavian artery stenosis. Vertebral arteries somewhat tortuous but are widely patent without stenosis, evidence for dissection or occlusion. IMPRESSION: MRA HEAD IMPRESSION: 1. Negative intracranial MRA for large vessel occlusion. 2. Diffuse intracranial atherosclerotic disease, overall mild for age. No hemodynamically significant or correctable stenosis. 3. 3 mm focal outpouching arising from the supraclinoid right ICA. While this is favored to reflect a vascular infundibulum, a small aneurysm is difficult to exclude. Attention at follow-up recommended. MRA NECK IMPRESSION: 1. 60% atheromatous stenosis at the left carotid bulb/proximal left ICA. 2. No hemodynamically significant stenosis about the right carotid artery system. 3. Wide patency of the vertebral arteries within the neck. 4. Diffuse tortuosity the major arterial vasculature of the neck, suggesting chronic underlying hypertension. Electronically Signed   By: Rise Mu M.D.   On: 02/20/2021 19:37    PHYSICAL EXAM General: Appears younger than stated age; pleasant elderly lady in no acute distress. Psych: Affect appropriate to situation Eyes: No scleral injection HENT: No OP obstrucion Head: Normocephalic.  Cardiovascular: Normal rate and regular rhythm.  Respiratory: Effort normal and breath sounds normal to anterior ascultation GI: Soft.  No distension. There is no tenderness.  Skin: WDI    Neurological Examination Mental Status: Alert, gives name, but no other  details. Speech is non-fluent, garbled and unable to name. Difficulty following commands, but able to do some simple commands.  Cranial Nerves: II: Right VFC noted III,IV, VI: right gaze pref, but able to cross midline. ptosis not present, extra-ocular motions intact bilaterally, pupils equal, round, reactive to light and accommodation V,VII: smile with right facial droop, facial light touch sensation normal bilaterally VIII: hearing normal bilaterally IX,X: uvula rises symmetrically XI: bilateral shoulder shrug XII: midline tongue extension Motor:Tone and bulk:normal tone throughout for age. She has more distal than prox right arm weakness, unable to fully grip, poor fine motor coordination. 4/5 in other extremities. Sensory: intact light touch intact throughout, bilaterally Deep Tendon Reflexes: 1+ and symmetric throughout Cerebellar: unable to complete, no gross ataxia out of proportion to weaknesses Gait: did not test  ASSESSMENT/PLAN [name] is a 87 y.o. female with history of dementia, colon abrnormality who present with aphasia, RUE incoordination and weakness and mild dysarthria. NO acute stroke treatments as there was no LVO and outside of time window for tPA.  Stroke: Likely cardieoembolic in setting of HF (20% EF), severely dilated LA may be c/w Afib, although not seen on tele thus far. Given age would not place loop and will need more GOC conversation if we were to start on OAC.  Code Stroke CTH was negative for a large hypodensity concerning for a large territory infarct. Small vessel disease. Atrophy. ASPECTS 10.    MRI  Acute infarct of the posterior left frontal lobe involving corona radiata extending to the cortex. Some involvement of lateral precentral gyrus. MRA head: no LVO, diffuse intracranial athero disease; possible vascular infundibulum seen at the supraclinoid RICA. MRA neck: 60% atheromatous stenosis at Lt carotid bulb/prox LICA, but no hemodynamically  significan stenosis. Tortuosity of major  arterial vasculature 2D Echo 20% EF, sev LA dilation LDL 157 HgbA1c 5.7 VTE prophylaxis - lovenox    Diet   Diet Heart Room service appropriate? No; Fluid consistency: Thin   none  prior to admission, now on aspirin 81 mg daily.  Therapy recommendations:  pending Disposition:  Pending  Hypertension Home meds:  none listed Stable Permissive hypertension (OK if < 220/120) but gradually normalize in 5-7 days Long-term BP goal normotensive  Hyperlipidemia Home meds:  none listed LDL 157, goal < 70 Add Lipitor 40mg   High intensity statin  Continue statin at discharge  Diabetes type II No dx HgbA1c 5.7, goal < 7.0 CBGs Recent Labs    02/20/21 1315  GLUCAP 88    SSI  Other Stroke Risk Factors Advanced Age >/= 69  Hx stroke/TIA Family hx stroke Coronary artery disease Congestive heart failure  Other Active Problems DNR/DNI confirmed with dtr at bedside. Supportive care and clinical course should be in line with [facility] day # 0  Desiree Metzger-Cihelka, ARNP-C, ANVP-BC Pager: [phone]  I have personally obtained history,examined this patient, reviewed notes, independently viewed imaging studies, participated in medical decision making and plan of care.ROS completed by me personally and pertinent positives fully documented  I have made any additions or clarifications directly to the above note. Agree with note above.  Patient has presented with left MCA infarct with resultant speech difficulties and right arm weakness.  Etiology likely cardiogenic embolism from a cardiomyopathy with strong suspicion for underlying A. fib but she also has moderate left carotid stenosis on MRA.  Recommend check carotid ultrasound and if greater than 70% stenosis noted may consider left carotid revascularization.  However advanced age and poor functional baseline needs to be considered before making definitive decisions after discussion of goals  of care with family.  Discussed with patient and daughter and answered questions.  Greater than 50% time during this 35-minute visit was spent in counseling and coordination of care and discussion with patient and family about stroke prevention and treatment and answered questions.  [phone], MD Medical Director [facility] Pager: [phone] 02/21/2021 5:59 PM  To contact Stroke Continuity provider, please refer to 02/23/2021. After hours, contact General Neurology

## 2021-02-21 NOTE — Progress Notes (Signed)
Family Medicine Teaching Service Daily Progress Note Intern Pager: [phone]  Patient name: Kristin Briggs Medical record number: [id number] Date of birth: 1920-01-14 Age: 87 y.o. Gender: female  Primary Care Provider: Pcp, No Consultants: Neurology Code Status: DNR   Pt Overview and Major Events to Date:  02/21/2021: Admitted to FPTS  Assessment and Plan: Lashana Spang is a 87 year old female presented with dysarthria, found to have acute ischemic stroke.  PMH is significant for colon abnormality.  Acute ischemic stroke Patient still has dysarthria this a.m. and decreased strength and coordination in right arm/hand. HbA1c 5.7%, LDL 157.  MRI brain shows acute infarct of posterior left frontal lobe involving corona radiata extending to the cortex.  MRA shows 60% at atheromatous stenosis at left carotid bulb/proximal left ICA stroke team is following patient.  Echocardiogram shows EF <20%. --Stroke team following, awaiting recommendations --ASA 325 mg --No plavix per neurology --Passed swallow screen, on heart healthy diet --Permissive HTN x 24 hrs (6-22 to 6-23) --PT/OT eval and treat --c/w Telemetry --Not considering Cardiology consult unless family wants aggressive mgt  CKD stage IIIb sCr1.25>1.20>1.04, improving. --BMP in am --Monitor closely --Avoid nephrotoxic agents  FEN/GI: Heart Healthy diet PPx: Lovenox Dispo:Home pending clinical improvement .   Subjective:  No acute overnight events. Patient denies any new complaints this am.   Objective: Temp:  [97.8 F (36.6 C)-98.1 F (36.7 C)] 98.1 F (36.7 C) (06/23 0723) Pulse Rate:  [60-107] 84 (06/23 0723) Resp:  [11-26] 20 (06/23 0723) BP: (122-157)/(57-102) 136/77 (06/23 0723) SpO2:  [87 %-100 %] 96 % (06/23 0723) Weight:  [123 lb 7.3 oz (56 kg)-127 lb 13.9 oz (58 kg)] 123 lb 7.3 oz (56 kg) (06/23 0504) Physical Exam: General: Pleasantly demented elderly lady lying comfortably in bed, NAD Cardiovascular: RRR,  systolic murmur + Respiratory: Clear to ausculation b/l Abdomen: soft, no-tender, non-distended, BS + Extremities: R extremity decreased strength, dysarthria +  Laboratory: Recent Labs  Lab 02/20/21 1315 02/20/21 1323  WBC 6.2  --   HGB 13.2 13.3  HCT 41.1 39.0  PLT 175  --    Recent Labs  Lab 02/20/21 1315 02/20/21 1323  NA 140 141  K 4.0 4.1  CL 107 108  CO2 24  --   BUN 14 18  CREATININE 1.25* 1.20*  CALCIUM 9.2  --   PROT 6.1*  --   BILITOT 0.9  --   ALKPHOS 62  --   ALT 10  --   AST 22  --   GLUCOSE 98 97    Imaging/Diagnostic Tests: MR ANGIO HEAD WO CONTRAST  Result Date: 02/20/2021 CLINICAL DATA:  Initial evaluation for acute stroke. EXAM: MRA HEAD WITHOUT CONTRAST MRA NECK WITHOUT AND WITH CONTRAST TECHNIQUE: Angiographic images of the Circle of Willis were obtained using MRA technique without intravenous contrast. Angiographic images of the neck were obtained using MRA technique without and with intravenous contrast. Carotid stenosis measurements (when applicable) are obtained utilizing NASCET criteria, using the distal internal carotid diameter as the denominator. CONTRAST:  8mL GADAVIST GADOBUTROL 1 MMOL/ML IV SOLN COMPARISON:  Previous CT and MRI from earlier the same day. FINDINGS: MRA HEAD FINDINGS ANTERIOR CIRCULATION: Visualized distal cervical segments of the internal carotid arteries are patent with antegrade flow. Petrous, cavernous, and supraclinoid segments patent without stenosis. 3 mm focal outpouching at the expected takeoff of the right posterior communicating artery is seen (series 7, image 97), favored to reflect a small vascular infundibulum, although a small aneurysm is difficult to  exclude. A1 segments patent bilaterally. Normal anterior communicating artery complex. Anterior cerebral arteries mildly irregular but are patent to their distal aspects without high-grade stenosis. M1 segments patent bilaterally without significant stenosis. Normal MCA  bifurcations. Distal MCA branches well perfused and symmetric, although demonstrate diffuse small vessel atheromatous irregularity. POSTERIOR CIRCULATION: Both vertebral arteries patent to the vertebrobasilar junction without stenosis. Left vertebral artery slightly dominant. Right PICA origin patent and normal. Left PICA not definitely seen. Basilar widely patent to its distal aspect without stenosis. Superior cerebellar arteries patent bilaterally. Scattered atheromatous irregularity seen within the PCAs without high-grade stenosis. PCAs remain well perfused to their distal aspects. A small left posterior communicating artery noted. MRA NECK FINDINGS AORTIC ARCH: Visualized aortic arch normal in caliber with normal 3 vessel morphology. No hemodynamically significant stenosis seen about the origin of the great vessels. RIGHT CAROTID SYSTEM: Right CCA mildly tortuous but is patent to the bifurcation without stenosis. No significant atheromatous narrowing or irregularity about the right carotid bulb or proximal right ICA. Right ICA tortuous but widely patent to the skull base without stenosis, evidence for dissection or occlusion. LEFT CAROTID SYSTEM: Left CCA mildly tortuous but is widely patent to the bifurcation. Eccentric plaque at the left carotid bulb/proximal left ICA with associated stenosis of up to 60% by NASCET criteria (series 1065, image 7). Left ICA tortuous but is widely patent distally without stenosis, evidence for dissection, or occlusion. VERTEBRAL ARTERIES: Both vertebral arteries arise from the subclavian arteries. No proximal subclavian artery stenosis. Vertebral arteries somewhat tortuous but are widely patent without stenosis, evidence for dissection or occlusion. IMPRESSION: MRA HEAD IMPRESSION: 1. Negative intracranial MRA for large vessel occlusion. 2. Diffuse intracranial atherosclerotic disease, overall mild for age. No hemodynamically significant or correctable stenosis. 3. 3 mm focal  outpouching arising from the supraclinoid right ICA. While this is favored to reflect a vascular infundibulum, a small aneurysm is difficult to exclude. Attention at follow-up recommended. MRA NECK IMPRESSION: 1. 60% atheromatous stenosis at the left carotid bulb/proximal left ICA. 2. No hemodynamically significant stenosis about the right carotid artery system. 3. Wide patency of the vertebral arteries within the neck. 4. Diffuse tortuosity the major arterial vasculature of the neck, suggesting chronic underlying hypertension. Electronically Signed   By: Rise Mu M.D.   On: 02/20/2021 19:37   MR ANGIO NECK W WO CONTRAST  Result Date: 02/20/2021 CLINICAL DATA:  Initial evaluation for acute stroke. EXAM: MRA HEAD WITHOUT CONTRAST MRA NECK WITHOUT AND WITH CONTRAST TECHNIQUE: Angiographic images of the Circle of Willis were obtained using MRA technique without intravenous contrast. Angiographic images of the neck were obtained using MRA technique without and with intravenous contrast. Carotid stenosis measurements (when applicable) are obtained utilizing NASCET criteria, using the distal internal carotid diameter as the denominator. CONTRAST:  5mL GADAVIST GADOBUTROL 1 MMOL/ML IV SOLN COMPARISON:  Previous CT and MRI from earlier the same day. FINDINGS: MRA HEAD FINDINGS ANTERIOR CIRCULATION: Visualized distal cervical segments of the internal carotid arteries are patent with antegrade flow. Petrous, cavernous, and supraclinoid segments patent without stenosis. 3 mm focal outpouching at the expected takeoff of the right posterior communicating artery is seen (series 7, image 97), favored to reflect a small vascular infundibulum, although a small aneurysm is difficult to exclude. A1 segments patent bilaterally. Normal anterior communicating artery complex. Anterior cerebral arteries mildly irregular but are patent to their distal aspects without high-grade stenosis. M1 segments patent bilaterally  without significant stenosis. Normal MCA bifurcations. Distal MCA branches well perfused  and symmetric, although demonstrate diffuse small vessel atheromatous irregularity. POSTERIOR CIRCULATION: Both vertebral arteries patent to the vertebrobasilar junction without stenosis. Left vertebral artery slightly dominant. Right PICA origin patent and normal. Left PICA not definitely seen. Basilar widely patent to its distal aspect without stenosis. Superior cerebellar arteries patent bilaterally. Scattered atheromatous irregularity seen within the PCAs without high-grade stenosis. PCAs remain well perfused to their distal aspects. A small left posterior communicating artery noted. MRA NECK FINDINGS AORTIC ARCH: Visualized aortic arch normal in caliber with normal 3 vessel morphology. No hemodynamically significant stenosis seen about the origin of the great vessels. RIGHT CAROTID SYSTEM: Right CCA mildly tortuous but is patent to the bifurcation without stenosis. No significant atheromatous narrowing or irregularity about the right carotid bulb or proximal right ICA. Right ICA tortuous but widely patent to the skull base without stenosis, evidence for dissection or occlusion. LEFT CAROTID SYSTEM: Left CCA mildly tortuous but is widely patent to the bifurcation. Eccentric plaque at the left carotid bulb/proximal left ICA with associated stenosis of up to 60% by NASCET criteria (series 1065, image 7). Left ICA tortuous but is widely patent distally without stenosis, evidence for dissection, or occlusion. VERTEBRAL ARTERIES: Both vertebral arteries arise from the subclavian arteries. No proximal subclavian artery stenosis. Vertebral arteries somewhat tortuous but are widely patent without stenosis, evidence for dissection or occlusion. IMPRESSION: MRA HEAD IMPRESSION: 1. Negative intracranial MRA for large vessel occlusion. 2. Diffuse intracranial atherosclerotic disease, overall mild for age. No hemodynamically significant  or correctable stenosis. 3. 3 mm focal outpouching arising from the supraclinoid right ICA. While this is favored to reflect a vascular infundibulum, a small aneurysm is difficult to exclude. Attention at follow-up recommended. MRA NECK IMPRESSION: 1. 60% atheromatous stenosis at the left carotid bulb/proximal left ICA. 2. No hemodynamically significant stenosis about the right carotid artery system. 3. Wide patency of the vertebral arteries within the neck. 4. Diffuse tortuosity the major arterial vasculature of the neck, suggesting chronic underlying hypertension. Electronically Signed   By: Rise Mu M.D.   On: 02/20/2021 19:37   MR BRAIN WO CONTRAST  Result Date: 02/20/2021 CLINICAL DATA:  Right-sided weakness and facial droop EXAM: MRI HEAD WITHOUT CONTRAST TECHNIQUE: Multiplanar, multiecho pulse sequences of the brain and surrounding structures were obtained without intravenous contrast. COMPARISON:  Correlation made with same day CT FINDINGS: Brain: There is restricted diffusion in the posterior left frontal lobe involving the corona radiata extending to the cortex with some involvement of the lateral precentral gyrus. Area of reduced diffusion in the left occipital lobe with ADC isointensity relative to cortex. Few additional punctate foci of cortical diffusion hyperintensity in the left frontal and parietal lobes as well as right frontal lobe. Chronic bilateral parietooccipital infarcts with some corresponding chronic blood products. Chronic small vessel infarcts of the central white matter and subinsular white matter. Small chronic infarcts of the right cerebellum. Additional patchy and confluent areas of T2 hyperintensity in the supratentorial white matter are nonspecific but probably reflect moderate chronic microvascular ischemic changes. Few additional punctate foci of susceptibility hypointensity likely reflect chronic microhemorrhages. Prominence of the ventricles and sulci reflects  generalized parenchymal volume loss. No significant mass effect.  No hydrocephalus. Vascular: Major vessel flow voids at the skull base are preserved. Skull and upper cervical spine: Normal marrow signal is preserved. Sinuses/Orbits: Paranasal sinuses are aerated. Orbits are unremarkable. Other: Sella is unremarkable.  Mastoid air cells are clear. IMPRESSION: Acute infarct of the posterior left frontal lobe involving corona radiata  extending to the cortex. Some involvement of lateral precentral gyrus. More subacute appearing left occipital infarct. Few additional punctate acute or subacute left frontoparietal and right frontal infarcts. Several chronic infarcts and moderate chronic microvascular ischemic changes. Initial results were reviewed by telephone at the time of interpretation on 02/20/2021 at 1:51 pm with provider [facility] , who verbally acknowledged these results. Electronically Signed   By: Guadlupe Spanish M.D.   On: 02/20/2021 14:00   ECHOCARDIOGRAM COMPLETE  Result Date: 02/20/2021    ECHOCARDIOGRAM REPORT   Patient Name:   ALVIS PULCINI Date of Exam: 02/20/2021 Medical Rec #:  [id number]       Height: Accession #:    [id number]      Weight:       127.9 lb Date of Birth:  [birth year]       BSA:          1.499 m Patient Age:    89 years        BP:           140/88 mmHg Patient Gender: F               HR:           81 bpm. Exam Location:  Inpatient Procedure: 2D Echo, Cardiac Doppler and Color Doppler Indications:    Stroke I63.9  History:        Patient has no prior history of Echocardiogram examinations.  Sonographer:    Elmarie Shiley Dance Referring Phys: [id number] JON KNAPP IMPRESSIONS  1. Global hypkinesis with akinesis of the posterolateral and basal to mid inferior myocardium. Left ventricular ejection fraction, by estimation, is <20%. The left ventricle has severely decreased function. The left ventricle demonstrates global hypokinesis. There is mild left ventricular hypertrophy of the basal-septal  segment. Left ventricular diastolic parameters are indeterminate.  2. Right ventricular systolic function is normal. The right ventricular size is normal. There is normal pulmonary artery systolic pressure.  3. Left atrial size was moderately dilated.  4. The mitral valve is degenerative. Mild mitral valve regurgitation. No evidence of mitral stenosis.  5. The aortic valve is calcified. There is mild calcification of the aortic valve. There is mild thickening of the aortic valve. Aortic valve regurgitation is not visualized. Mild aortic valve stenosis. Aortic valve area, by VTI measures 0.77 cm. Aortic valve mean gradient measures 11.2 mmHg. Aortic valve Vmax measures 2.18 m/s.  6. The inferior vena cava is normal in size with greater than 50% respiratory variability, suggesting right atrial pressure of 3 mmHg. FINDINGS  Left Ventricle: Global hypkinesis with akinesis of the posterolateral and basal to mid inferior myocardium. Left ventricular ejection fraction, by estimation, is <20%. The left ventricle has severely decreased function. The left ventricle demonstrates global hypokinesis. The left ventricular internal cavity size was normal in size. There is mild left ventricular hypertrophy of the basal-septal segment. Left ventricular diastolic parameters are indeterminate. Right Ventricle: The right ventricular size is normal. No increase in right ventricular wall thickness. Right ventricular systolic function is normal. There is normal pulmonary artery systolic pressure. The tricuspid regurgitant velocity is 1.22 m/s, and  with an assumed right atrial pressure of 3 mmHg, the estimated right ventricular systolic pressure is 9.0 mmHg. Left Atrium: Left atrial size was moderately dilated. Right Atrium: Right atrial size was normal in size. Pericardium: There is no evidence of pericardial effusion. Mitral Valve: The mitral valve is degenerative in appearance. There is mild thickening of the mitral valve leaflet(s).  There is mild calcification of the mitral valve leaflet(s). Mild mitral annular calcification. Mild mitral valve regurgitation. No evidence of mitral valve stenosis. Tricuspid Valve: The tricuspid valve is normal in structure. Tricuspid valve regurgitation is trivial. No evidence of tricuspid stenosis. Aortic Valve: The aortic valve is calcified. There is mild calcification of the aortic valve. There is mild thickening of the aortic valve. Aortic valve regurgitation is not visualized. Mild aortic stenosis is present. Aortic valve mean gradient measures  11.2 mmHg. Aortic valve peak gradient measures 19.0 mmHg. Aortic valve area, by VTI measures 0.77 cm. Pulmonic Valve: The pulmonic valve was normal in structure. Pulmonic valve regurgitation is not visualized. No evidence of pulmonic stenosis. Aorta: The aortic root is normal in size and structure. Venous: The inferior vena cava is normal in size with greater than 50% respiratory variability, suggesting right atrial pressure of 3 mmHg. IAS/Shunts: No atrial level shunt detected by color flow Doppler.  LEFT VENTRICLE PLAX 2D LVIDd:         5.50 cm LVIDs:         5.20 cm LV PW:         0.90 cm LV IVS:        1.20 cm LVOT diam:     1.80 cm LV SV:         35 LV SV Index:   24 LVOT Area:     2.54 cm  RIGHT VENTRICLE          IVC RV Basal diam:  1.80 cm  IVC diam: 1.60 cm TAPSE (M-mode): 1.3 cm LEFT ATRIUM             Index       RIGHT ATRIUM           Index LA diam:        3.00 cm 2.00 cm/m  RA Area:     10.40 cm LA Vol (A2C):   40.1 ml 26.76 ml/m RA Volume:   23.20 ml  15.48 ml/m LA Vol (A4C):   26.3 ml 17.55 ml/m LA Biplane Vol: 34.0 ml 22.69 ml/m  AORTIC VALVE AV Area (Vmax):    0.84 cm AV Area (Vmean):   0.75 cm AV Area (VTI):     0.77 cm AV Vmax:           218.00 cm/s AV Vmean:          158.000 cm/s AV VTI:            0.458 m AV Peak Grad:      19.0 mmHg AV Mean Grad:      11.2 mmHg LVOT Vmax:         71.80 cm/s LVOT Vmean:        46.700 cm/s LVOT VTI:           0.139 m LVOT/AV VTI ratio: 0.30  AORTA Ao Root diam: 2.90 cm Ao Asc diam:  3.30 cm MITRAL VALVE                TRICUSPID VALVE MV Area (PHT): 4.86 cm     TR Peak grad:   6.0 mmHg MV Decel Time: 156 msec     TR Vmax:        122.00 cm/s MV E velocity: 133.00 cm/s                             SHUNTS  Systemic VTI:  0.14 m                             Systemic Diam: 1.80 cm Chilton Si MD Electronically signed by Chilton Si MD Signature Date/Time: 02/20/2021/6:17:50 PM    Final    CT HEAD CODE STROKE WO CONTRAST  Result Date: 02/20/2021 CLINICAL DATA:  Code stroke.  Right-sided weakness and facial droop EXAM: CT HEAD WITHOUT CONTRAST TECHNIQUE: Contiguous axial images were obtained from the base of the skull through the vertex without intravenous contrast. COMPARISON:  None. FINDINGS: Brain: No acute intracranial hemorrhage, mass effect, or edema. Likely a chronic infarcts of bilateral parietal and left occipital lobes. Additional patchy and confluent areas of low-attenuation in the supratentorial white matter nonspecific but may reflect moderate chronic microvascular ischemic changes. Prominence of the ventricles and sulci reflects generalized parenchymal volume loss. No extra-axial collection. Vascular: No hyperdense vessel. There is intracranial atherosclerotic calcification at the skull base. Skull: Unremarkable. Sinuses/Orbits: Mild right maxillary sinus mucosal thickening. No significant orbital abnormality. Other: Mastoid air cells are clear. ASPECTS [facility] Stroke Program Early CT Score) - Ganglionic level infarction (caudate, lentiform nuclei, internal capsule, insula, M1-M3 cortex): - Supraganglionic infarction (M4-M6 cortex): Total score (0-10 with 10 being normal): IMPRESSION: There is no acute intracranial hemorrhage or evidence of acute infarction. ASPECT score is 10. 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 Moderate chronic microvascular  ischemic changes. Electronically Signed   By: Guadlupe Spanish M.D.   On: 02/20/2021 13:34     Rayvin Abid, Geralynn Rile, MD 02/21/2021, 9:36 AM PGY-1, [facility] Family Medicine FPTS Intern pager: [phone], text pages welcome

## 2021-02-21 NOTE — ED Notes (Signed)
This RN called to check status of IP bed assignment. Per Diplomatic Services operational officer, room is still being cleaned. Will continue to monitor for bed ready status.

## 2021-02-22 ENCOUNTER — Inpatient Hospital Stay (HOSPITAL_COMMUNITY): Payer: Medicare Other

## 2021-02-22 ENCOUNTER — Ambulatory Visit: Payer: Medicare Other | Admitting: Nurse Practitioner

## 2021-02-22 DIAGNOSIS — Z7189 Other specified counseling: Secondary | ICD-10-CM

## 2021-02-22 DIAGNOSIS — Z515 Encounter for palliative care: Secondary | ICD-10-CM

## 2021-02-22 DIAGNOSIS — I639 Cerebral infarction, unspecified: Secondary | ICD-10-CM

## 2021-02-22 LAB — BASIC METABOLIC PANEL
Anion gap: 17 — ABNORMAL HIGH (ref 5–15)
BUN: 17 mg/dL (ref 8–23)
CO2: 20 mmol/L — ABNORMAL LOW (ref 22–32)
Calcium: 9.7 mg/dL (ref 8.9–10.3)
Chloride: 103 mmol/L (ref 98–111)
Creatinine, Ser: 1.26 mg/dL — ABNORMAL HIGH (ref 0.44–1.00)
GFR, Estimated: 39 mL/min — ABNORMAL LOW (ref >60)
Glucose, Bld: 113 mg/dL — ABNORMAL HIGH (ref 70–99)
Potassium: 3.9 mmol/L (ref 3.5–5.1)
Sodium: 140 mmol/L (ref 135–145)

## 2021-02-22 LAB — CBC
HCT: 41.9 % (ref 36.0–46.0)
Hemoglobin: 13.3 g/dL (ref 12.0–15.0)
MCH: 30.6 pg (ref 26.0–34.0)
MCHC: 31.7 g/dL (ref 30.0–36.0)
MCV: 96.3 fL (ref 80.0–100.0)
Platelets: 204 10*3/uL (ref 150–400)
RBC: 4.35 MIL/uL (ref 3.87–5.11)
RDW: 13.1 % (ref 11.5–15.5)
WBC: 9.1 10*3/uL (ref 4.0–10.5)
nRBC: 0 % (ref 0.0–0.2)

## 2021-02-22 IMAGING — DX DG CHEST 1V
1 series · 1 of 1 positions shown · non-contrast
Comparison: None.

CLINICAL DATA: Altered mental status.

EXAM:
CHEST  1 VIEW

[chest]
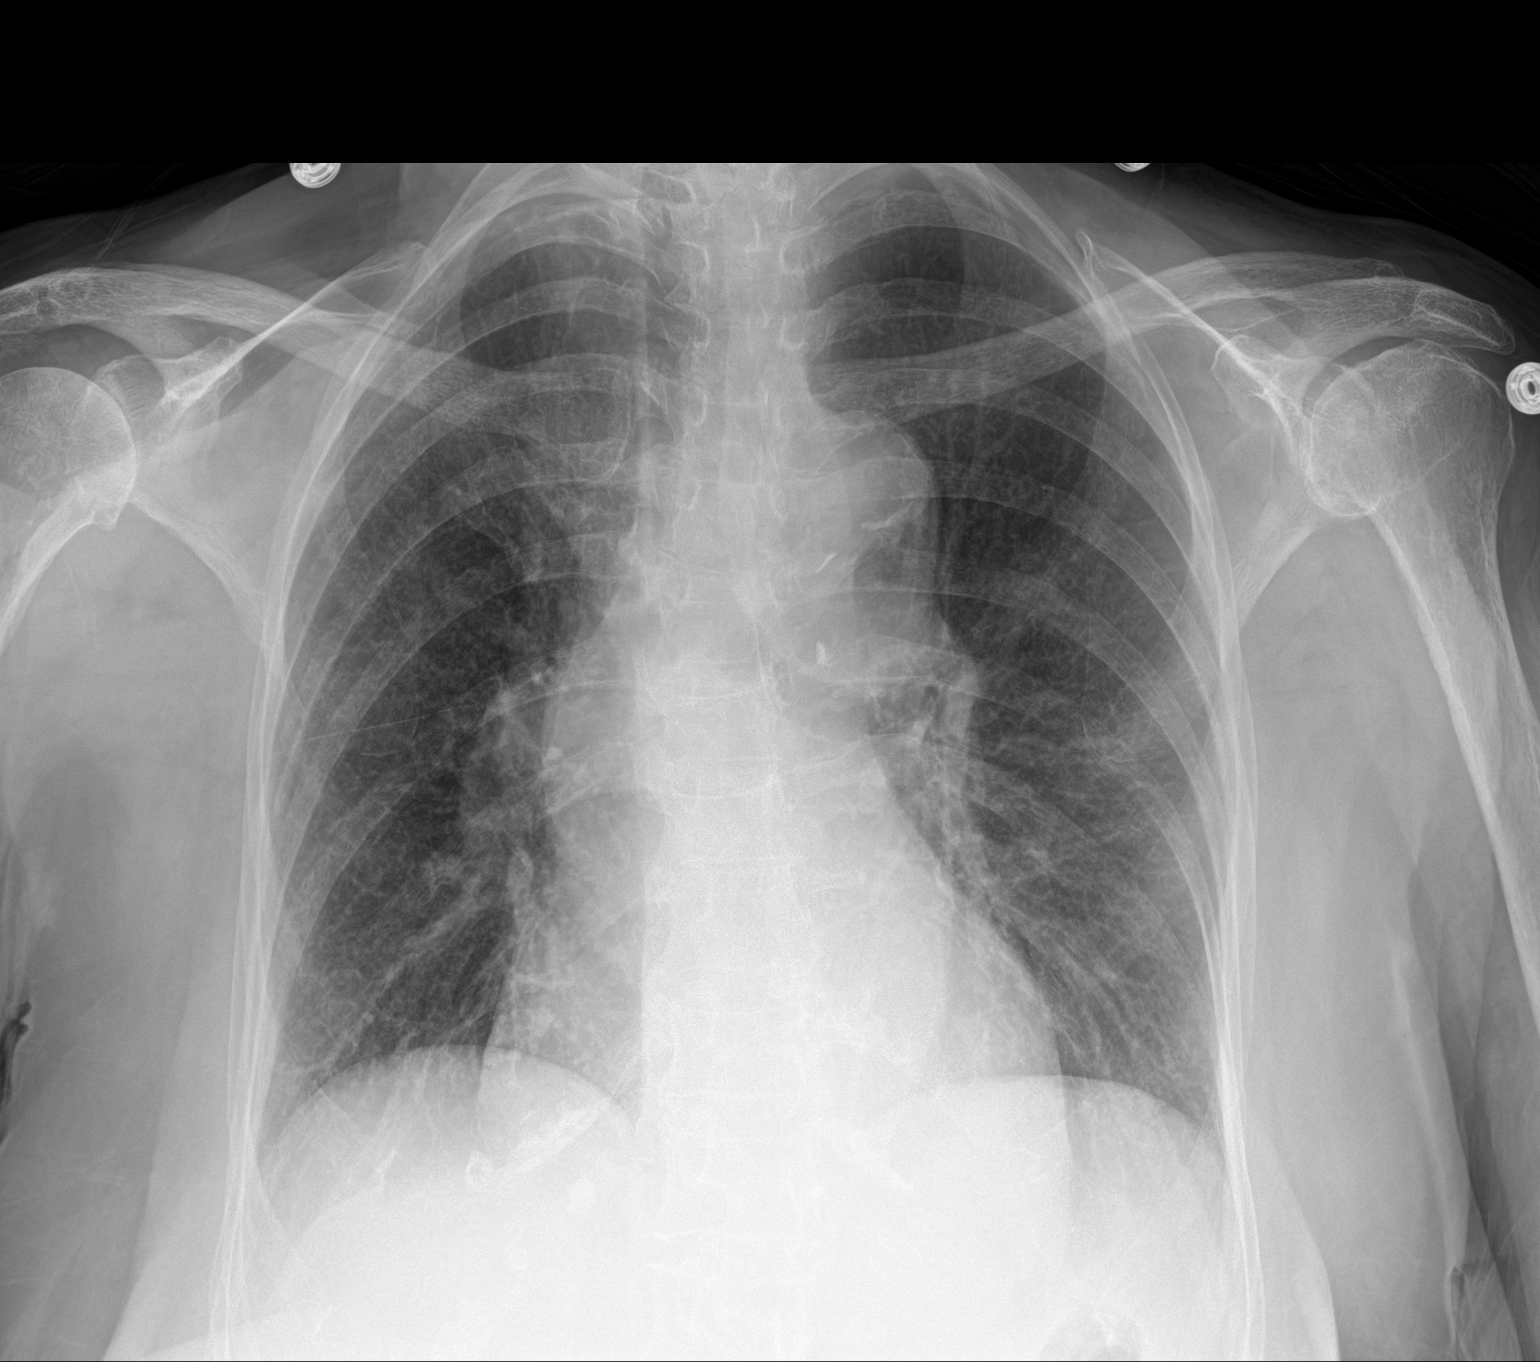

[1 of 1 positions shown; findings below may reference images not displayed]

FINDINGS: The heart size and mediastinal contours are within normal limits.
Both lungs are clear. The visualized skeletal structures are
unremarkable.
IMPRESSION: No active disease.

## 2021-02-22 MED ORDER — LIP MEDEX EX OINT
1.0000 "application " | TOPICAL_OINTMENT | CUTANEOUS | Status: DC | PRN
  Filled 2021-02-22: qty 7

## 2021-02-22 MED ORDER — SODIUM CHLORIDE 0.9 % IV SOLN: INTRAVENOUS | Status: DC

## 2021-02-22 MED ORDER — QUETIAPINE FUMARATE 25 MG PO TABS
25.0000 mg | ORAL_TABLET | Freq: Every day | ORAL | Status: DC
  Administered 2021-02-22 – 2021-02-24 (×3): 25 mg via ORAL
  Filled 2021-02-22 (×3): qty 1

## 2021-02-22 MED ORDER — ONDANSETRON 4 MG PO TBDP
4.0000 mg | ORAL_TABLET | Freq: Once | ORAL | Status: AC
  Administered 2021-02-22: 4 mg via ORAL
  Filled 2021-02-22: qty 1

## 2021-02-22 MED ORDER — SODIUM CHLORIDE 0.9 % IV SOLN: INTRAVENOUS | Status: AC

## 2021-02-22 NOTE — Consult Note (Signed)
Palliative Medicine Inpatient Consult Note  Reason for consult:  "Goals of Care Discussion with family, concern for worsening of condition"  HPI:  Per intake H&P --> [name] is a 87 y.o. female with history of dementia, heart failure (EF 20%), colon abrnormality who present with aphasia, RUE incoordination and weakness and mild dysarthria.  Aroura suffered an acute ischemic stroke.  Palliative care was asked to discuss goals of care in the setting of progressive dementia and concern for recurrent aspirational events.   Clinical Assessment/Goals of Care:  *Please note that this is a verbal dictation therefore any spelling or grammatical errors are due to the "[place]" system interpretation.  I have reviewed medical records including EPIC notes, labs and imaging, received report from bedside RN, assessed the patient who is lying in bed in no acute distress.      I met with Rosann Auerbach, patient's daughter and Danae Chen, patient's granddaughter to further discuss diagnosis prognosis, GOC, EOL wishes, disposition and options.   I introduced Palliative Medicine as specialized medical care for people living with serious illness. It focuses on providing relief from the symptoms and stress of a serious illness. The goal is to improve quality of life for both the patient and the family.  Kristin Briggs is from [place] originally.  She lived in [place] till the age of 43.  She then was moved by her daughter Rosann Auerbach to [place] right outside [place] in 13-Nov-2013.  She was married though her husband passed away in Nov 14, 2015.  She worked for 30 years is a member at the [employer].  For enjoyment Letisha bowled, swam, would square dance,went on bus trips  to the [place].  She is a spiritual woman but does not practice within any specific denomination.  Prior to hospitalization Kristin Briggs relocated with her family from [place] to [place], [place].  This transition was recent within the last  month.  She had been able to mobilize with a front wheel walker.  Her daughter helps support her with her basic activities of daily living.  Per Prudencio Pair had suffered from mild cognitive impairment since 2003/11/14.  She had been for the most part doing well until the last few years when more of her family members had passed away and Kristin Briggs had made more common comments such as "I am ready".  Per her daughter she has not had a tremendously large health history and other than colitis had not been hospitalized but when giving birth to her children.  At the present time we reviewed the CVA that Dylann had endured and what this could mean in the long run inclusive of lack of appetite and possibly recurrent aspirational events.  We discussed that if she is aspirating recurrently it does not bode well in the long run.  A detailed discussion was had today regarding advanced directives, none on file though Rosann Auerbach is Britnee's primary caregiver and does share that her mother has been clear about not wanting heroic measures in the past which she and her family members will respect.    Concepts specific to code status, artifical feeding and hydration, continued IV antibiotics and rehospitalization was had.  Patient is an established DO NOT RESUSCITATE DO NOT INTUBATE CODE STATUS.  Per her daughter she would not want life-prolonging measures to maintain or sustain life it if it were her time to pass.  Given patient's acute decline within the last few days this is clearly difficult for her daughter and granddaughter to  come to grips with.  We reviewed to continue current measures to see if improvements could be made by time trial of therapies.  I offered if Carlyn neglects to improve then it would be very reasonable to considered a comfort oriented approach to care and further recognize the need for hospice.  Per Rosann Auerbach she has had hospice in the past with her father and she respects and values their services.  If  hospice were elected it would be home hospice.  We have agreed to enable more family the time to come in and see Kristin Briggs in her present state to arrange for a family meeting on Sunday at 55 AM.  Symptoms perspective provided education on delirium given patient's already known dementia.  Reviewed that this is all the worse in the hospital after and during an acute event.  Discussed how her circadian rhythm is thrown off and how this can be detrimental in the long run.  Reviewed that if stable enough moving forward we may consider some form of a sleep aid to further support sleep renewal.  Discussed the importance of continued conversation with family and their  medical providers regarding overall plan of care and treatment options, ensuring decisions are within the context of the patients values and GOCs.  Provided "Hard Choices for [employer]" booklet.   Decision Maker: Charolette Child: [phone]  SUMMARY OF RECOMMENDATIONS   DNAR/DNI  Plan to obtain a copy of advance directives  Complete a MOST prior to discharge  Time trial of therapies to see if improvements can be made  Plan for family meeting Sunday at 54 AM  Family goals are for patient to go to rehabilitation and gain strength  Ongoing palliative medicine team support  Code Status/Advance Care Planning: DNAR/DNI   Palliative Prophylaxis:  Care, delirium precautions  Additional Recommendations (Limitations, Scope, Preferences): What is treatable, no heroic life-prolonging measures   Psycho-social/Spiritual:  Desire for further Chaplaincy support: No Additional Recommendations: Education on chronic debilitating diseases   Prognosis: Unclear though in the setting of an acute CVA patient is now struggling with adequate p.o. intake she would have never wanted a feeding tube therefore we will need to continue to see what her progress is.  Discharge Planning: Discharge plan is uncertain  Vitals:   09/81/19 1204  02/22/21 1558  BP: (!) 142/79 (!) 141/77  Pulse: 100 98  Resp: 16 19  Temp: 98.4 F (36.9 C) 98.3 F (36.8 C)  SpO2: 98% 95%    Intake/Output Summary (Last 24 hours) at 02/22/2021 1733 Last data filed at 02/22/2021 1725 Gross per 24 hour  Intake 594.19 ml  Output 100 ml  Net 494.19 ml   Last Weight  Most recent update: 02/21/2021  5:05 AM    Weight  56 kg (123 lb 7.3 oz)            Gen: Elderly female HEENT: moist mucous membranes CV: Irregular rate and rhythm  PULM: On RA, intermittent coughing ABD: soft/nontender EXT: No edema Neuro: Lethargic  PPS: 30%   This conversation/these recommendations were discussed with patient primary care team, Dr. Manus Rudd  Time In: 1630 Time Out: 1745 Total Time: 75 Greater than 50%  of this time was spent counseling and coordinating care related to the above assessment and plan.  [facility] Team Team Cell Phone: [phone] Please utilize secure chat with additional questions, if there is no response within 30 minutes please call the above phone number  Palliative Medicine Team  providers are available by phone from 7am to 7pm daily and can be reached through the team cell phone.  Should this patient require assistance outside of these hours, please call the patient's attending physician.

## 2021-02-22 NOTE — Progress Notes (Signed)
STROKE TEAM PROGRESS NOTE   INTERVAL HISTORY Her  caregiver at home)is at the bedside.   . Pt remains  aphasic.  But does try to follow simple midline and one-step commands.    Carotid ultrasound preliminary report shows only 1-39% left ICA stenosis.  Therapist recommend rehab in a skilled nursing setting Vitals:   02/22/21 0028 02/22/21 0406 02/22/21 0800 02/22/21 1204  BP:  (!) 157/63 134/73 (!) 142/79  Pulse:  99 (!) 107 100  Resp: 18 20 18 16   Temp:  98.5 F (36.9 C) 98.1 F (36.7 C) 98.4 F (36.9 C)  TempSrc:  Oral Oral Oral  SpO2:  92% (!) 89% 98%  Weight:      Height:       CBC:  Recent Labs  Lab 02/20/21 1315 02/20/21 1323 02/21/21 1010 02/22/21 0228  WBC 6.2  --  7.9 9.1  NEUTROABS 4.1  --   --   --   HGB 13.2   < > 13.0 13.3  HCT 41.1   < > 41.7 41.9  MCV 96.3  --  95.4 96.3  PLT 175  --  197 204   < > = values in this interval not displayed.   Basic Metabolic Panel:  Recent Labs  Lab 02/21/21 1010 02/22/21 0228  NA 141 140  K 3.9 3.9  CL 106 103  CO2 24 20*  GLUCOSE 74 113*  BUN 13 17  CREATININE 1.04* 1.26*  CALCIUM 9.2 9.7   Lipid Panel:  Recent Labs  Lab 02/21/21 0451  CHOL 239*  TRIG 123  HDL 57  CHOLHDL 4.2  VLDL 25  LDLCALC 02/23/21*   HgbA1c:  Recent Labs  Lab 02/21/21 0451  HGBA1C 5.7*   Urine Drug Screen: No results for input(s): LABOPIA, COCAINSCRNUR, LABBENZ, AMPHETMU, THCU, LABBARB in the last 168 hours.  Alcohol Level No results for input(s): ETH in the last 168 hours.  IMAGING past 24 hours No results found.  PHYSICAL EXAM General: Appears younger than stated age; pleasant elderly lady in no acute distress. Psych: Affect appropriate to situation Eyes: No scleral injection HENT: No OP obstrucion Head: Normocephalic.  Cardiovascular: Normal rate and regular rhythm.  Respiratory: Effort normal and breath sounds normal to anterior ascultation GI: Soft.  No distension. There is no tenderness.  Skin: WDI    Neurological  Examination Mental Status: Alert, gives name, but no other details. Speech is non-fluent, garbled and unable to name. Difficulty following commands, but able to do some simple commands.  Cranial Nerves: II: Right VFC noted III,IV, VI: right gaze pref, but able to cross midline. ptosis not present, extra-ocular motions intact bilaterally, pupils equal, round, reactive to light and accommodation V,VII: smile with right facial droop, facial light touch sensation normal bilaterally VIII: hearing normal bilaterally IX,X: uvula rises symmetrically XI: bilateral shoulder shrug XII: midline tongue extension Motor:Tone and bulk:normal tone throughout for age. She has more distal than prox right arm weakness, unable to fully grip, poor fine motor coordination. 4/5 in other extremities. Sensory: intact light touch intact throughout, bilaterally Deep Tendon Reflexes: 1+ and symmetric throughout Cerebellar: unable to complete, no gross ataxia out of proportion to weaknesses Gait: did not test  ASSESSMENT/PLAN Kristin Briggs is a 87 y.o. female with history of dementia, colon abrnormality who present with aphasia, RUE incoordination and weakness and mild dysarthria. NO acute stroke treatments as there was no LVO and outside of time window for tPA.  Stroke: Likely cardieoembolic in setting  of HF (20% EF), severely dilated LA may be c/w Afib, although not seen on tele thus far. Given age would not place loop and will need more GOC conversation if we were to start on OAC.  Code Stroke CTH was negative for a large hypodensity concerning for a large territory infarct. Small vessel disease. Atrophy. ASPECTS 10.    MRI  Acute infarct of the posterior left frontal lobe involving corona radiata extending to the cortex. Some involvement of lateral precentral gyrus. MRA head: no LVO, diffuse intracranial athero disease; possible vascular infundibulum seen at the supraclinoid RICA. MRA neck: 60% atheromatous  stenosis at Lt carotid bulb/prox LICA, but no hemodynamically significan stenosis. Tortuosity of major arterial vasculature 2D Echo 20% EF, sev LA dilation LDL 157 HgbA1c 5.7 VTE prophylaxis - lovenox    Diet   Diet Heart Room service appropriate? No; Fluid consistency: Thin   none  prior to admission, now on aspirin 81 mg daily.  Therapy recommendations:  SNF Disposition:  Pending  Hypertension Home meds:  none listed Stable Permissive hypertension (OK if < 220/120) but gradually normalize in 5-7 days Long-term BP goal normotensive  Hyperlipidemia Home meds:  none listed LDL 157, goal < 70 Add Lipitor 40mg   High intensity statin  Continue statin at discharge  Diabetes type II No dx HgbA1c 5.7, goal < 7.0 CBGs Recent Labs    02/20/21 1315  GLUCAP 88    SSI  Other Stroke Risk Factors Advanced Age >/= 29  Hx stroke/TIA Family hx stroke Coronary artery disease Congestive heart failure  Other Active Problems DNR/DNI confirmed with dtr at bedside. Supportive care and clinical course should be in line with [facility] day # 1 Patient has presented with left MCA infarct with resultant speech difficulties and right arm weakness.  Etiology likely cardiogenic embolism from a cardiomyopathy with strong suspicion for underlying A. fib .  Continue aspirin for stroke prevention.  And aggressive risk factor modification..  Given patient's advanced age and baseline dementia she perhaps is not a good candidate for long-term anticoagulation hence will not do prolonged cardiac monitoring for A. fib.  Need to discuss goals of care with family and if they do not want skilled nursing facility palliative care approach would also be appropriate.  Stroke team will sign off.  Kindly call for questions.  No family available at bedside for discussion.   [facility], MD Medical Director [facility] Pager: [phone] 02/22/2021 2:10 PM  To contact Stroke Continuity  provider, please refer to 02/24/2021. After hours, contact General Neurology

## 2021-02-22 NOTE — Progress Notes (Signed)
Spoke with family regarding concern for patient's worsening condition given poor oral intake and new cough and dysarthria.  They expressed similar concerns.  Indicated they were fine with Palliative consult.  Had discussion with Palliative and wish to continued treatment at this time but if worsens wish to transition to comfort care. - follow up chest X-Ray due to concern for possible pneumonia - Plan to participate in Family Meeting at 11 AM on Sunday

## 2021-02-22 NOTE — Progress Notes (Signed)
After hearing the patient coughing and throwing up, I came into the room to sit her up. I suctioned the green emesis from her mouth and tried to hold her upright. Patient continued to cough and vomit for about 10 minutes before settling down. Patient continues to try and clear her throat, but has had no more episodes of vomiting and is refusing sips of water. Will leave door open to ensure patient is safe, and keep her sitting upright in case more episodes continue.

## 2021-02-22 NOTE — Progress Notes (Signed)
Physical Therapy Treatment Patient Details Name: Kristin Briggs MRN: [id number] DOB: 08/13/1920 Today's Date: 02/22/2021    History of Present Illness Pt is a 87 y.o. female admitted 02/20/21 as code stroke with dysarthria, LKW time on 6/21 at 2330. MRI showed acute infarct in posterior L frontal lobe; subacute L occipital infarct; punctate acute/subacute L frontoparietal and R frontal infarcts; several chronic infarcts, microvascular ischemic changes. PMH includes CKD, dementia.    PT Comments    Patient received in bed sleeping but easily woken; difficult to understand due to aphasia, but able to get simple points across. Needed heavy +2 physical assist for all tasks today, and demonstrates heavy posterior lean and right pushing at EOB. Able to practice multiple sit to stands at EOB with +2 assist, interestingly enough has much less right pushing in standing. Does seem to have neglect of R UE as well as likely L global inattention today. Attempted sidestepping but needed totalAx2 due to lack of initiation and fear of movement. Question if she is experiencing central dizziness s/p CVA- will continue to investigate as able. Left in bed with all needs met, bed alarm active. Would likely do best in SNF setting.     Follow Up Recommendations  SNF;Supervision/Assistance - 24 hour     Equipment Recommendations  Other (comment) (TBD pending progress)    Recommendations for Other Services       Precautions / Restrictions Precautions Precautions: Fall Precaution Comments: decrease in motor control of R hand, inattention to R UE, L inattention Restrictions Weight Bearing Restrictions: No    Mobility  Bed Mobility Overal bed mobility: Needs Assistance Bed Mobility: Supine to Sit;Sit to Supine     Supine to sit: Max assist;+2 for physical assistance Sit to supine: Max assist;+2 for physical assistance   General bed mobility comments: maxaX2 for all aspects of bed mobility for trunk and  BLE management; heavy posterior and right pushing noted today    Transfers Overall transfer level: Needs assistance Equipment used: Rolling walker (2 wheeled) Transfers: Sit to/from Stand Sit to Stand: Mod assist;+2 physical assistance         General transfer comment: needed MaxAx2 on first standing attempt and was screaming in fear, however once she understood what we wanted her to do, was able to initiate standing and assist faded to ModAx2. less lean/pushing in standing.  Ambulation/Gait             General Gait Details: needed totalAX2 just to take a couple of side steps up the side of the bed, no initiation and very anxious   Stairs             Wheelchair Mobility    Modified Rankin (Stroke Patients Only)       Balance Overall balance assessment: Needs assistance Sitting-balance support: No upper extremity supported;Feet supported Sitting balance-Leahy Scale: Poor Sitting balance - Comments: posterior and right pushing Postural control: Posterior lean;Right lateral lean   Standing balance-Leahy Scale: Poor Standing balance comment: reliant on external support and BUE support                            Cognition Arousal/Alertness: Awake/alert Behavior During Therapy: WFL for tasks assessed/performed Overall Cognitive Status: Impaired/Different from baseline                     Current Attention Level: Sustained   Following Commands: Follows one step commands consistently;Follows one step commands with   increased time Safety/Judgement: Decreased awareness of safety;Decreased awareness of deficits Awareness: Intellectual Problem Solving: Slow processing;Decreased initiation;Difficulty sequencing;Requires verbal cues;Requires tactile cues General Comments: hx of dementia. Definite inattention to left side today, also interestingly enough definite neglect of R UE. Able to follow simple cues with increased time. Very anxious regarding  movement and will often scream in surprise      Exercises      General Comments        Pertinent Vitals/Pain Pain Assessment: Faces Pain Score: 0-No pain Faces Pain Scale: No hurt Pain Intervention(s): Limited activity within patient's tolerance;Monitored during session    Home Living                      Prior Function            PT Goals (current goals can now be found in the care plan section) Acute Rehab PT Goals Patient Stated Goal: Happy to get out of bed PT Goal Formulation: With patient Time For Goal Achievement: 03/07/21 Potential to Achieve Goals: Fair Progress towards PT goals: Progressing toward goals    Frequency    Min 3X/week      PT Plan Discharge plan needs to be updated;Frequency needs to be updated    Co-evaluation              AM-PAC PT "6 Clicks" Mobility   Outcome Measure  Help needed turning from your back to your side while in a flat bed without using bedrails?: A Lot Help needed moving from lying on your back to sitting on the side of a flat bed without using bedrails?: Total Help needed moving to and from a bed to a chair (including a wheelchair)?: Total Help needed standing up from a chair using your arms (e.g., wheelchair or bedside chair)?: Total Help needed to walk in hospital room?: Total Help needed climbing 3-5 steps with a railing? : Total 6 Click Score: 7    End of Session Equipment Utilized During Treatment: Gait belt Activity Tolerance: Patient tolerated treatment well Patient left: in bed;with call bell/phone within reach;with bed alarm set Nurse Communication: Mobility status PT Visit Diagnosis: Other abnormalities of gait and mobility (R26.89);Other symptoms and signs involving the nervous system (R29.898);Muscle weakness (generalized) (M62.81)     Time: 1133-1148 PT Time Calculation (min) (ACUTE ONLY): 15 min  Charges:  $Therapeutic Activity: 8-22 mins                     U PT, DPT, PN1    Supplemental Physical Therapist [place]    Pager [phone] Acute Rehab Office [phone]  

## 2021-02-22 NOTE — NC FL2 (Signed)
[place] MEDICAID FL2 LEVEL OF CARE SCREENING TOOL     IDENTIFICATION  Patient Name: Kristin Briggs Birthdate: 1919-10-13 Sex: female Admission Date (Current Location): 02/20/2021  [facility] and [place] Number:  Producer, television/film/video and Address:  [facility] [place], [address] [place], [place] [address]      Provider Number: [id number]  Attending Physician Name and Address:  Carney Living, MD  Relative Name and Phone Number:  [name], daughter, [phone]    Current Level of Care: Hospital Recommended Level of Care: Skilled Nursing Facility Prior Approval Number:    Date Approved/Denied:   PASRR Number: [id number]  Discharge Plan: SNF    Current Diagnoses: Patient Active Problem List   Diagnosis Date Noted   Acute ischemic stroke (HCC) 02/21/2021   Cerebrovascular accident (CVA) (HCC) 02/20/2021    Orientation RESPIRATION BLADDER Height & Weight     Self  Normal Incontinent, External catheter Weight: 123 lb 7.3 oz (56 kg) Height:  5' (152.4 cm)  BEHAVIORAL SYMPTOMS/MOOD NEUROLOGICAL BOWEL NUTRITION STATUS      Continent Diet (Please see DC Summary)  AMBULATORY STATUS COMMUNICATION OF NEEDS Skin   Extensive Assist Verbally Normal                       Personal Care Assistance Level of Assistance  Bathing, Feeding, Dressing Bathing Assistance: Maximum assistance Feeding assistance: Limited assistance Dressing Assistance: Limited assistance     Functional Limitations Info  Sight Sight Info: Impaired        SPECIAL CARE FACTORS FREQUENCY  PT (By licensed PT), OT (By licensed OT), Speech therapy     PT Frequency: 5x/week OT Frequency: 5x/week     Speech Therapy Frequency: 2x/week      Contractures Contractures Info: Not present    Additional Factors Info  Code Status, Allergies Code Status Info: DNR Allergies Info: NKA           Current Medications (02/22/2021):  This is the current hospital active  medication list Current Facility-Administered Medications  Medication Dose Route Frequency Provider Last Rate Last Admin   0.9 %  sodium chloride infusion   Intravenous Continuous Lilland, Alana, DO 75 mL/hr at 02/22/21 1225 New Bag at 02/22/21 1225   acetaminophen (TYLENOL) tablet 650 mg  650 mg Oral Q4H PRN Lilland, Alana, DO   650 mg at 02/21/21 2122   Or   acetaminophen (TYLENOL) 160 MG/5ML solution 650 mg  650 mg Per Tube Q4H PRN Lilland, Alana, DO       Or   acetaminophen (TYLENOL) suppository 650 mg  650 mg Rectal Q4H PRN Lilland, Alana, DO       aspirin EC tablet 81 mg  81 mg Oral Daily Lilland, Alana, DO   81 mg at 02/21/21 8182   atorvastatin (LIPITOR) tablet 40 mg  40 mg Oral Daily Metzger-Cihelka, Desiree, NP   40 mg at 02/21/21 2121   enoxaparin (LOVENOX) injection 30 mg  30 mg Subcutaneous Q24H Simmons-Robinson, Makiera, MD   30 mg at 02/22/21 1225   sodium chloride flush (NS) 0.9 % injection 3 mL  3 mL Intravenous Once Lilland, Alana, DO         Discharge Medications: Please see discharge summary for a list of discharge medications.  Relevant Imaging Results:  Relevant Lab Results:   Additional Information SSN: [id number]. Pfizer COVID-19 Vaccine 08/14/2020 , 11/09/2019 , 10/12/2019  Mearl Latin, LCSW

## 2021-02-22 NOTE — Progress Notes (Signed)
FPTS Interim Progress Note  S: Paged by RN that patient was "agitated", trying to get out of bed repeatedly, and was scared whenever anyone comes into the room. RN requesting IV medication such as Ativan.  O: BP 128/69 (BP Location: Left Leg)   Pulse 92   Temp 98 F (36.7 C) (Oral)   Resp 18 Comment: manually count  Ht 5' (1.524 m)   Wt 56 kg   SpO2 94%   BMI 24.11 kg/m   Gen: alert, appears somewhat restless but is calm, not agitated or aggressive, not attempting to get out of bed, soft restraint in place on right wrist  A/P: Dementia  Patient with baseline dementia, likely a component of hospital delirium as well. She is confused and somewhat restless, but is currently calm and resting appropriately in bed with soft restraint in place on right wrist. -Continue soft wrist restraints due to fall/safety risk -Delirium precautions. Emphasized need to minimize interruptions overnight -No indication for medication intervention at this time -Can consider Seroquel qHS   Maury Dus, MD 02/22/2021, 3:09 AM PGY-1, [facility] Family Medicine Service pager [phone]

## 2021-02-22 NOTE — Progress Notes (Signed)
Inpatient Rehab Admissions Coordinator:   Note PT recs updated to SNF.  Would not pursue CIR at this time.   Estill Dooms, PT, DPT Admissions Coordinator [phone] 02/22/21  1:08 PM

## 2021-02-22 NOTE — Progress Notes (Signed)
Carotid duplex has been completed.  Results can be found under chart review under CV PROC. 02/22/2021 4:21 PM Ginna Schuur RVT, RDMS

## 2021-02-22 NOTE — Progress Notes (Addendum)
At bed time patient was agitated, trying to get out from bed and she  keep pulling tele monitor leads. Dr. Anner Crete notified, got bilateral soft wrist restrain order and tele DC for 48 hours. MD came unit to  assess the patient and verbally suggested: "let the patient rest and sleep quietly in room and do not interrupt her sleep".

## 2021-02-22 NOTE — Progress Notes (Signed)
Family Medicine Teaching Service Daily Progress Note Intern Pager: [phone]  Patient name: VILA DORY Medical record number: [id number] Date of birth: 1920/05/19 Age: 87 y.o. Gender: female  Primary Care Provider: Pcp, No Consultants: Neuro Code Status: Full  Pt Overview and Major Events to Date:  6/23- Admitted   Assessment and Plan: Sweta Halseth is a 87 year old female presented with dysarthria, found to have acute ischemic stroke.  PMH is significant for colon abnormality.  Acute ischemic stroke PT/OT recommending SNF vs CIR.  Has cough and emesis today.  Concern for swallowing or worsening of stroke.  Still having Dysarthria.  Plan to reach out to speech again. - Neuro following, appreciate recs - Aspirin 81 mg daily - Will start IV fluids 75 mL/hr - GoC discussion with daughter regarding further management and dispo - PT/OT eval and treat  AKI on CKD stage IIIA Cr- 1.25>1.20>1.04>1.26.  Has had poor PO intake reported via nurse - Will start IV fluids - Strict I's/O's.    Dementia Agitated trying to get out of bed at night.  Placed on soft restraints.  PT/OT reccomending SNF vs CIR.   - Plan to reach out to daughter - Consider Palliative Care consult     FEN/GI: Heart healthy diet PPx: Lovenox Dispo:Home in 3 or more days. Barriers include SNP vs CIR.   Subjective:  Unable to understand patient this morning.  Objective: Temp:  [97.6 F (36.4 C)-98.5 F (36.9 C)] 98.1 F (36.7 C) (06/24 0800) Pulse Rate:  [83-107] 107 (06/24 0800) Resp:  [15-23] 18 (06/24 0800) BP: (128-157)/(60-113) 134/73 (06/24 0800) SpO2:  [89 %-95 %] 89 % (06/24 0800) Physical Exam:  Physical Exam Constitutional:      General: She is not in acute distress. HENT:     Head: Normocephalic and atraumatic.     Mouth/Throat:     Mouth: Mucous membranes are moist.  Cardiovascular:     Rate and Rhythm: Normal rate and regular rhythm.  Abdominal:     General: Abdomen is flat.      Palpations: Abdomen is soft.  Neurological:     Mental Status: She is alert.     Motor: Weakness present.     Comments: Weakness in right arm, dysarthria     Laboratory: Recent Labs  Lab 02/20/21 1315 02/20/21 1323 02/21/21 1010 02/22/21 0228  WBC 6.2  --  7.9 9.1  HGB 13.2 13.3 13.0 13.3  HCT 41.1 39.0 41.7 41.9  PLT 175  --  197 204   Recent Labs  Lab 02/20/21 1315 02/20/21 1323 02/21/21 1010 02/22/21 0228  NA 140 141 141 140  K 4.0 4.1 3.9 3.9  CL 107 108 106 103  CO2 24  --  24 20*  BUN 14 18 13 17   CREATININE 1.25* 1.20* 1.04* 1.26*  CALCIUM 9.2  --  9.2 9.7  PROT 6.1*  --   --   --   BILITOT 0.9  --   --   --   ALKPHOS 62  --   --   --   ALT 10  --   --   --   AST 22  --   --   --   GLUCOSE 98 97 74 113*    Imaging/Diagnostic Tests: No new imaging  , MD 02/22/2021, 8:21 AM PGY-1, [facility] Family Medicine FPTS Intern pager: [phone], text pages welcome

## 2021-02-22 NOTE — Progress Notes (Signed)
Inpatient Rehab Admissions Coordinator Note:   Per PT recommendations, pt was screened for CIR candidacy by Estill Dooms, PT, DPT.  At this time note OT recommending SNF and SLP recommending HHSLP.  If pt/family would like to be considered for CIR admit, please place an IP Rehab MD consult.  Please contact me with questions.   Estill Dooms, PT, DPT [phone] 02/22/21 10:44 AM

## 2021-02-22 NOTE — TOC Initial Note (Addendum)
Transition of Care [facility]) - Initial/Assessment Note    Patient Details  Name: Kristin Briggs MRN: [id number] Date of Birth: 27-Aug-1920  [facility] CM/SW Contact:    Mearl Latin, LCSW Phone Number: 02/22/2021, 3:50 PM  Clinical Narrative:                 CSW received consult for possible SNF placement at time of discharge. CSW spoke with patient's daughter and granddaughter at bedside. Patient's daughter reported that she is currently unable to care for patient at their home given patient's current physical needs and fall risk. She expressed understanding of PT recommendation and is agreeable to SNF placement at time of discharge. Their family just recently moved here and patient's new PCP appointment is not until August. CSW discussed insurance authorization process and provided Medicare SNF ratings list. Patient has received the COVID vaccines. CSW will follow up with bed offers.   Update: CSW provided bed offers so far.    Expected Discharge Plan: Skilled Nursing Facility Barriers to Discharge: SNF Pending bed offer   Patient Goals and CMS Choice Patient states their goals for this hospitalization and ongoing recovery are:: Rehab CMS Medicare.gov Compare Post Acute Care list provided to:: Patient Represenative (must comment) Choice offered to / list presented to : Adult Children (and granddaughter)  Expected Discharge Plan and Services Expected Discharge Plan: Skilled Nursing Facility In-house Referral: Clinical Social Work   Post Acute Care Choice: Skilled Nursing Facility Living arrangements for the past 2 months: Single Family Home                                      Prior Living Arrangements/Services Living arrangements for the past 2 months: Single Family Home Lives with:: Adult Children Patient language and need for interpreter reviewed:: Yes Do you feel safe going back to the place where you live?: Yes      Need for Family Participation in  Patient Care: Yes (Comment) Care giver support system in place?: Yes (comment) Current home services: DME Criminal Activity/Legal Involvement Pertinent to Current Situation/Hospitalization: No - Comment as needed  Activities of Daily Living Home Assistive Devices/Equipment: Other (Comment) ADL Screening (condition at time of admission) Patient's cognitive ability adequate to safely complete daily activities?: No Is the patient deaf or have difficulty hearing?: No Does the patient have difficulty seeing, even when wearing glasses/contacts?: Yes Does the patient have difficulty concentrating, remembering, or making decisions?: Yes Patient able to express need for assistance with ADLs?: No Does the patient have difficulty dressing or bathing?: Yes Independently performs ADLs?: No Communication: Appropriate for developmental age Dressing (OT): Needs assistance Is this a change from baseline?: Pre-admission baseline Grooming: Needs assistance Is this a change from baseline?: Pre-admission baseline Feeding: Needs assistance Is this a change from baseline?: Pre-admission baseline Bathing: Needs assistance Is this a change from baseline?: Pre-admission baseline Toileting: Needs assistance Is this a change from baseline?: Pre-admission baseline In/Out Bed: Needs assistance Is this a change from baseline?: Pre-admission baseline Walks in Home: Needs assistance Is this a change from baseline?: Pre-admission baseline Does the patient have difficulty walking or climbing stairs?: Yes Weakness of Legs: Both Weakness of Arms/Hands: None  Permission Sought/Granted Permission sought to share information with : Facility Medical sales representative, Family Supports Permission granted to share information with : No  Share Information with NAME: Raiford Noble  Permission granted to share info w AGENCY:  SNFs  Permission granted to share info w Relationship: Daughter  Permission granted to share info w Contact  Information: [phone]  Emotional Assessment Appearance:: Appears stated age Attitude/Demeanor/Rapport: Unable to Assess Affect (typically observed): Unable to Assess Orientation: : Oriented to Self Alcohol / Substance Use: Not Applicable Psych Involvement: No (comment)  Admission diagnosis:  Acute ischemic stroke [facility]) [I63.9] Cerebrovascular accident (CVA), unspecified mechanism (HCC) [I63.9] Patient Active Problem List   Diagnosis Date Noted   Acute ischemic stroke (HCC) 02/21/2021   Cerebrovascular accident (CVA) (HCC) 02/20/2021   PCP:  Oneita Hurt, No Pharmacy:   [facility] [phone] - [place], [place] - [address] AT [facility] [address] [facility] [address] [place] [address] Phone: [phone] Fax: [phone]     Social Determinants of Health (SDOH) Interventions    Readmission Risk Interventions No flowsheet data found.

## 2021-02-23 LAB — CBC
HCT: 36.9 % (ref 36.0–46.0)
Hemoglobin: 11.6 g/dL — ABNORMAL LOW (ref 12.0–15.0)
MCH: 30.4 pg (ref 26.0–34.0)
MCHC: 31.4 g/dL (ref 30.0–36.0)
MCV: 96.9 fL (ref 80.0–100.0)
Platelets: 184 10*3/uL (ref 150–400)
RBC: 3.81 MIL/uL — ABNORMAL LOW (ref 3.87–5.11)
RDW: 13.2 % (ref 11.5–15.5)
WBC: 7.3 10*3/uL (ref 4.0–10.5)
nRBC: 0 % (ref 0.0–0.2)

## 2021-02-23 LAB — BASIC METABOLIC PANEL
Anion gap: 10 (ref 5–15)
BUN: 18 mg/dL (ref 8–23)
CO2: 22 mmol/L (ref 22–32)
Calcium: 8.6 mg/dL — ABNORMAL LOW (ref 8.9–10.3)
Chloride: 109 mmol/L (ref 98–111)
Creatinine, Ser: 1.12 mg/dL — ABNORMAL HIGH (ref 0.44–1.00)
GFR, Estimated: 44 mL/min — ABNORMAL LOW (ref >60)
Glucose, Bld: 65 mg/dL — ABNORMAL LOW (ref 70–99)
Potassium: 3.8 mmol/L (ref 3.5–5.1)
Sodium: 141 mmol/L (ref 135–145)

## 2021-02-23 NOTE — Evaluation (Addendum)
Clinical/Bedside Swallow Evaluation Patient Details  Name: Kristin Briggs MRN: [id number] Date of Birth: 18-Apr-1920  Today's Date: 02/23/2021 Time: SLP Start Time (ACUTE ONLY): 1326 SLP Stop Time (ACUTE ONLY): 1359 SLP Time Calculation (min) (ACUTE ONLY): 33 min  Past Medical History:  Past Medical History:  Diagnosis Date   Cervical osteoarthritis    Chronic diastolic heart failure (HCC)    Colon abnormality    Inflammed, Per [facility] New Patient Packet   Colonic diverticular abscess    Dementia (HCC)    Dyspnea on exertion    Edema    High cholesterol    Hypertension    Impacted cerumen    Mild cognitive impairment    PVC (premature ventricular contraction)    Systolic murmur    Past Surgical History:  Past Surgical History:  Procedure Laterality Date   GALLBLADDER SURGERY  2004   Per [facility] New Patient Packet   HPI:  Pt is a 87 yo female adm to [facility] with right facial droop, aphasia, right sided weakness.  Per imaging, pt with "posterior left frontal lobe involving corona  radiata extending to the cortex. Some involvement of lateral  precentral gyrus.     More subacute appearing left occipital infarct. Few additional punctate acute or subacute left frontoparietal and right frontal infarcts. Several chronic infarcts and moderate chronic microvascular ischemic changes." Pt also has h/o cogntitive deficit diagnosed in 2005 but not worked up further per daughter.  Pt resides with her daughter and granddaughter and was fluent prior to admission. She reports she enjoys sitting on the porch and watching cars and people.   Assessment / Plan / Recommendation Clinical Impression  Pt presents with mild oral dysphagia c/b decreased labial seal and reduced lingual strength.  These deficits resulted in prolonged oral phase with solids although pt acheived full oral clearance indpenedently.  Pt benefited from placement of straw and bolus trials on L (stronger) side of oral cavity.  Pt intermittently  required multiple swallow per bolus with puree and solids.  Suspect piecemeal deglutition related to deficits in bolus transit.  There were no clinical s/s of aspiration with any consistencies trialed.  Family reports poor PO intake, but was eager with trials.  At home, pt can be picky but eats unrestricted diet.  Her favorite is baby back ribs. Hopefully, family will be able to bring her some for her birthday next week. Family meeting around goals of care planned for tomorrow, but family is adamant that long term alternate means of nutrition is not desired.  Fortunately, pt's swallow function seems adequate.  Her most recent CXR 6/24 was clear.    Consider RD consult to optimize PO intake.  Pt has carnation instant breakfast every morning.  She might benefit from ensure-like supplement.  Strawberry is her favorite.   Recommend continuing regular texture diet with thin liquids.  SLP will continue to follow for speech and language deficits.   SLP Visit Diagnosis: Dysphagia, oral phase (R13.11)    Aspiration Risk  Mild aspiration risk    Diet Recommendation Regular;Thin liquid   Liquid Administration via: Straw (Place on L side of oral cavity) Supervision: Staff to assist with self feeding (1:1 assist with PO intake.  Place food/drink on L side of oral cavity) Compensations: Slow rate;Small sips/bites (Place food/drink on L side of oral cavity) Postural Changes: Seated upright at 90 degrees    Other  Recommendations Oral Care Recommendations: Oral care BID   Follow up Recommendations None (for dysphagia.  Continue ST for speech-language deficits at next level of care.)      Frequency and Duration  (N/A (for dysphagia.  Continue ST for speech-language deficits))          Prognosis Prognosis for Safe Diet Advancement:  (N/A)      Swallow Study   General Date of Onset: 02/20/21 HPI: Pt is a 87 yo female adm to [facility] with right facial droop, aphasia, right sided weakness.  Per imaging, pt  with "posterior left frontal lobe involving corona  radiata extending to the cortex. Some involvement of lateral  precentral gyrus.     More subacute appearing left occipital infarct. Few additional punctate acute or subacute left frontoparietal and right frontal infarcts. Several chronic infarcts and moderate chronic microvascular ischemic changes." Pt also has h/o cogntitive deficit diagnosed in 2005 but not worked up further per daughter.  Pt resides with her daughter and granddaughter and was fluent prior to admission. She reports she enjoys sitting on the porch and watching cars and people. Type of Study: Bedside Swallow Evaluation Previous Swallow Assessment: none Diet Prior to this Study: Regular;Thin liquids Temperature Spikes Noted: No History of Recent Intubation: No Behavior/Cognition: Alert;Cooperative;Pleasant mood Oral Cavity Assessment: Within Functional Limits Oral Care Completed by SLP: No Oral Cavity - Dentition: Missing dentition;Edentulous (top arch) Self-Feeding Abilities: Total assist Patient Positioning: Upright in bed Baseline Vocal Quality: Normal Volitional Cough: Strong Volitional Swallow: Able to elicit    Oral/Motor/Sensory Function Overall Oral Motor/Sensory Function: Moderate impairment Facial ROM: Reduced right Facial Symmetry: Abnormal symmetry right Facial Strength: Reduced right Lingual ROM: Reduced right Lingual Symmetry: Abnormal symmetry right Lingual Strength: Reduced Velum: Within Functional Limits Mandible: Within Functional Limits   Ice Chips Ice chips: Not tested   Thin Liquid Thin Liquid: Within functional limits Presentation: Cup;Straw    Nectar Thick     Honey Thick     Puree Puree: Within functional limits Presentation: Spoon Pharyngeal Phase Impairments: Multiple swallows   Solid     Solid: Impaired Presentation:  (SLP fed) Oral Phase Functional Implications: Prolonged oral transit Pharyngeal Phase Impairments: Multiple swallows       Kerrie Pleasure, MA, CCC-SLP Acute Rehabilitation Services Office: [phone]; Pager (6/24): [phone] 02/23/2021,2:19 PM

## 2021-02-23 NOTE — Progress Notes (Addendum)
Family Medicine Teaching Service Daily Progress Note Intern Pager: [phone]  Patient name: Kristin SimmondsDorothy D Briggs Medical record number: [id number] Date of birth: May 30, 1920 Age: 87 y.o. Gender: female  Primary Care Provider: Pcp, No Consultants: Neuro Code Status: Full  Pt Overview and Major Events to Date:  6/23- Admitted   Assessment and Plan: Kristin Briggs is a 87 year old female presented with dysarthria, found to have acute ischemic stroke.  PMH is significant for colon abnormality.  Acute ischemic stroke (Left MCA)  Neurology recommended high intensity statin and ASA. No Plavix due to age. PT/OT recommends SNF. Continues to have dysarthria. SLP recommends HH SLP.  - Neuro following, appreciate recs - Continue ASA and Lipitor daily - GOC discussion with daughter regarding further management and dispo - PT/OT : SNF - SLP: HH SLP, await recommendations concerning dysphagia   AKI on CKD stage IIIA Cr- 1.25>>>1.12.  Likely pre-renal due to poor po intake. No baseline available [place].  - Continue IVF 6575mL/hr, discontinue when good po intake  - Strict I's/O's.    Dementia - Family meeting on tomorrow (11/26) to determine GOC.  - Consider Palliative Care consult     FEN/GI: Heart healthy diet PPx: Lovenox Dispo:Home in 3 or more days. Barriers include SNP vs CIR.   Subjective:  No significant overnight events.   Temp:  [97.5 F (36.4 C)-98.4 F (36.9 C)] 97.5 F (36.4 C) (06/25 0759) Pulse Rate:  [62-100] 63 (06/25 0759) Resp:  [14-19] 14 (06/25 0759) BP: (124-142)/(43-79) 132/52 (06/25 0759) SpO2:  [92 %-98 %] 93 % (06/25 0759) Physical Exam: GEN: resting in bed  in no acute distress  CV: regular rate and rhythm RESP: no increased work of breathing, clear to ascultation bilaterally  ABD: Bowel sounds present, soft, non-tender MSK: no LE edema SKIN: warm, dry NEURO: dysarthria    Laboratory: Recent Labs  Lab 02/21/21 1010 02/22/21 0228  02/23/21 0128  WBC 7.9 9.1 7.3  HGB 13.0 13.3 11.6*  HCT 41.7 41.9 36.9  PLT 197 204 184    Recent Labs  Lab 02/20/21 1315 02/20/21 1323 02/21/21 1010 02/22/21 0228 02/23/21 0128  NA 140   < > 141 140 141  K 4.0   < > 3.9 3.9 3.8  CL 107   < > 106 103 109  CO2 24  --  24 20* 22  BUN 14   < > 13 17 18   CREATININE 1.25*   < > 1.04* 1.26* 1.12*  CALCIUM 9.2  --  9.2 9.7 8.6*  PROT 6.1*  --   --   --   --   BILITOT 0.9  --   --   --   --   ALKPHOS 62  --   --   --   --   ALT 10  --   --   --   --   AST 22  --   --   --   --   GLUCOSE 98   < > 74 113* 65*   < > = values in this interval not displayed.     Imaging/Diagnostic Tests: DG Chest 1 View  Result Date: 02/22/2021 CLINICAL DATA:  Altered mental status. EXAM: CHEST  1 VIEW COMPARISON:  None. FINDINGS: The heart size and mediastinal contours are within normal limits. Both lungs are clear. The visualized skeletal structures are unremarkable. IMPRESSION: No active disease. Electronically Signed   By: Obie DredgeWilliam T Derry M.D.   On: 02/22/2021 17:59  VAS US CAROTID  Result Date: 02/22/2021 Carotid Arterial Duplex Study Patient Name:  Kristin Briggs  Date of Exam:   02/22/2021 Medical Rec #: [id number]        Accession #:    [id number] Date of Birth: 04-27-1920        Patient Gender: F Patient Age:   89Y Exam Location:  [facility] Procedure:      VAS US CAROTID Referring Phys: [id number] PRAMOD S SETHI --------------------------------------------------------------------------------  Indications:       CVA and Mild aphasia & RUE numbness. Risk Factors:      None. Comparison Study:  MRA 02/20/21 - LT Carotid bulb & prox ICA 60% Performing Technologist: Jody Hill RVT, RDMS  Examination Guidelines: A complete evaluation includes B-mode imaging, spectral Doppler, color Doppler, and power Doppler as needed of all accessible portions of each vessel. Bilateral testing is considered an integral part of a complete examination. Limited  examinations for reoccurring indications may be performed as noted.  Right Carotid Findings: +----------+--------+--------+--------+---------------------+------------------+           PSV cm/sEDV cm/sStenosisPlaque Description   Comments           +----------+--------+--------+--------+---------------------+------------------+ CCA Prox  63      8                                    intimal thickening +----------+--------+--------+--------+---------------------+------------------+ CCA Distal62      11                                   intimal thickening +----------+--------+--------+--------+---------------------+------------------+ ICA Prox  55      17              heterogenous and                                                          focal                                   +----------+--------+--------+--------+---------------------+------------------+ ICA Distal69      16                                                      +----------+--------+--------+--------+---------------------+------------------+ ECA       47                                                              +----------+--------+--------+--------+---------------------+------------------+ +----------+--------+-------+----------------+-------------------+           PSV cm/sEDV cmsDescribe        Arm Pressure (mmHG) +----------+--------+-------+----------------+-------------------+ [id number]             Multiphasic, WNL                    +----------+--------+-------+----------------+-------------------+ +---------+--------+--+--------+-+---------+  VertebralPSV cm/s32EDV cm/s8Antegrade +---------+--------+--+--------+-+---------+  Left Carotid Findings: +----------+-------+--------+--------+-----------------------+-----------------+           PSV    EDV cm/sStenosisPlaque Description     Comments                    cm/s                                                             +----------+-------+--------+--------+-----------------------+-----------------+ CCA Prox  76     11                                                       +----------+-------+--------+--------+-----------------------+-----------------+ CCA Distal63     12                                     intimal                                                                   thickening        +----------+-------+--------+--------+-----------------------+-----------------+ ICA Prox  89     15      1-39%   calcific and                                                              heterogenous                             +----------+-------+--------+--------+-----------------------+-----------------+ ICA Distal64     17                                                       +----------+-------+--------+--------+-----------------------+-----------------+ ECA       33     0                                                        +----------+-------+--------+--------+-----------------------+-----------------+ +----------+--------+--------+--------+-------------------+           PSV cm/sEDV cm/sDescribeArm Pressure (mmHG) +----------+--------+--------+--------+-------------------+ Subclavian110                                         +----------+--------+--------+--------+-------------------+ +---------+--------+--+--------+--+ VertebralPSV cm/s41EDV cm/s12 +---------+--------+--+--------+--+  Summary: Right Carotid: Velocities in the right ICA are consistent with a 1-39% stenosis.                Non-hemodynamically significant plaque <50% noted in the CCA. The                ECA appears <50% stenosed. Left Carotid: Velocities in the left ICA are consistent with a 1-39% stenosis.               Non-hemodynamically significant plaque <50% noted in the CCA. The               ECA appears <50% stenosed. Vertebrals:  Bilateral vertebral arteries demonstrate  antegrade flow. Subclavians: Normal flow hemodynamics were seen in bilateral subclavian              arteries. *See table(s) above for measurements and observations.     Preliminary      Katha Cabal, DO 02/23/2021, 9:55 AM PGY-2, [facility] FPTS Intern pager: [phone], text pages welcome

## 2021-02-23 NOTE — Progress Notes (Signed)
   Palliative Medicine Inpatient Follow Up Note  Reason for consult:  "Goals of Care Discussion with family, concern for worsening of condition"   HPI:  Per intake H&P --> [name] is a 87 y.o. female with history of dementia, heart failure (EF 20%), colon abrnormality who present with aphasia, RUE incoordination and weakness and mild dysarthria.  Kaina suffered an acute ischemic stroke.   Palliative care was asked to discuss goals of care in the setting of progressive dementia and concern for recurrent aspirational events.  Today's Discussion (02/23/2021):  *Please note that this is a verbal dictation therefore any spelling or grammatical errors are due to the "[place] [employer]" system interpretation.  Chart reviewed.  Met at bedside with patient's daughter, Kristin Briggs.  Kristin Briggs shares with me that Kristin Briggs has made some improvements today and was able to remain awake and eat and drink out of the left side of her mouth which seems more coordinated than the right side of her mouth.  She expresses hope for improvement at skilled nursing.  We further reviewed the plan for a family meeting tomorrow at 38 AM to discuss what the future should look like.  I shared the plan to have outpatient palliative care follow along upon discharge as if Kristin Briggs does not do well it will be easier to transition her to a comfort/hospice approach of care.  Questions and concerns addressed   Objective Assessment: Vital Signs Vitals:   02/23/21 0759 02/23/21 1158  BP: (!) 132/52 (!) 134/53  Pulse: 63 81  Resp: 14 14  Temp: (!) 97.5 F (36.4 C) 97.6 F (36.4 C)  SpO2: 93% 94%    Intake/Output Summary (Last 24 hours) at 02/23/2021 1610 Last data filed at 02/23/2021 1500 Gross per 24 hour  Intake 1984.1 ml  Output 100 ml  Net 1884.1 ml   Last Weight  Most recent update: 02/21/2021  5:05 AM    Weight  56 kg (123 lb 7.3 oz)            Gen: Elderly female HEENT: moist mucous membranes CV:  Irregular rate and rhythm PULM: On RA, intermittent coughing ABD: soft/nontender EXT: No edema Neuro: Sleeping  SUMMARY OF RECOMMENDATIONS   DNAR/DNI   Plan to obtain a copy of advance directives   Complete a MOST prior to discharge   Time trial of therapies to see if improvements can be made   Plan for family meeting Sunday at 11 AM   Family goals are for patient to go to rehabilitation and gain strength   Ongoing palliative medicine team support  Time Spent: 25 Greater than 50% of the time was spent in counseling and coordination of care ______________________________________________________________________________________ [place] Team Team Cell Phone: [phone] Please utilize secure chat with additional questions, if there is no response within 30 minutes please call the above phone number  Palliative Medicine Team providers are available by phone from 7am to 7pm daily and can be reached through the team cell phone.  Should this patient require assistance outside of these hours, please call the patient's attending physician.

## 2021-02-24 LAB — BASIC METABOLIC PANEL
Anion gap: 9 (ref 5–15)
BUN: 19 mg/dL (ref 8–23)
CO2: 22 mmol/L (ref 22–32)
Calcium: 9 mg/dL (ref 8.9–10.3)
Chloride: 111 mmol/L (ref 98–111)
Creatinine, Ser: 0.98 mg/dL (ref 0.44–1.00)
GFR, Estimated: 52 mL/min — ABNORMAL LOW (ref >60)
Glucose, Bld: 68 mg/dL — ABNORMAL LOW (ref 70–99)
Potassium: 4 mmol/L (ref 3.5–5.1)
Sodium: 142 mmol/L (ref 135–145)

## 2021-02-24 LAB — GLUCOSE, CAPILLARY: Glucose-Capillary: 84 mg/dL (ref 70–99)

## 2021-02-24 MED ORDER — DEXTROSE-NACL 5-0.9 % IV SOLN: INTRAVENOUS | Status: DC

## 2021-02-24 NOTE — Progress Notes (Signed)
Inpatient Rehab Admissions Coordinator:   Pt. Rescreened in light of updated PT recs for CIR. At this time, Pt. Appears to have functional decline and is a potential candidate for CIR. Will place order for rehab consult per protocol.  Please contact me with questions.   Megan Salon, MS, CCC-SLP Rehab Admissions Coordinator  [phone] (celll) [phone] (office)

## 2021-02-24 NOTE — Progress Notes (Signed)
Physical Therapy Treatment Patient Details Name: Kristin Briggs MRN: [id number] DOB: 1919-09-17 Today's Date: 02/24/2021    History of Present Illness Pt is a 87 y.o. female admitted 02/20/21 as code stroke with dysarthria, LKW time on 6/21 at 2330. MRI showed acute infarct in posterior L frontal lobe; subacute L occipital infarct; punctate acute/subacute L frontoparietal and R frontal infarcts; several chronic infarcts, microvascular ischemic changes. PMH includes CKD, dementia.    PT Comments    Received message from MD re: family request to reassess patient for possible CIR admission as she has improved. Family not present during reassessment, but was called afterwards for updated information.   Patient moving Right extremities better today and able to complete bed mobility with 1 person moderate assist. She was not pushing and able to sit EOB 5 minutes with closeguarding (total time resting between standing trials). She was not able to fully stand with 1 person assist (appeared fear may have caused  her to hold back a bit), but was able to achieve ~3/4 standing. Do feel she has had significant improvement compared to prior sessions and agree with CIR referral. Left message for OT to see if they can work with pt 6/27 a.m. to also reassess for CIR.    Follow Up Recommendations  Supervision/Assistance - 24 hour;CIR (pt has improved; recommendation updated)     Equipment Recommendations  Other (comment) (TBD pending progress)    Recommendations for Other Services       Precautions / Restrictions Precautions Precautions: Fall Precaution Comments: decrease in motor control of R hand, inattention to R UE, L inattention    Mobility  Bed Mobility Overal bed mobility: Needs Assistance Bed Mobility: Rolling;Sidelying to Sit;Sit to Sidelying Rolling: Mod assist Sidelying to sit: Mod assist;HOB elevated     Sit to sidelying: Mod assist General bed mobility comments: 1 person assist for  all bed mobility    Transfers Overall transfer level: Needs assistance Equipment used: 1 person hand held assist Transfers: Sit to/from Stand Sit to Stand: Mod assist         General transfer comment: did not achieve full standing (but ~3/4 stnding) with 1 person assist (+2 not available); 2 reps  Ambulation/Gait             General Gait Details: unable +1 assist   Stairs             Wheelchair Mobility    Modified Rankin (Stroke Patients Only) Modified Rankin (Stroke Patients Only) Pre-Morbid Rankin Score: Moderate disability Modified Rankin: Severe disability     Balance Overall balance assessment: Needs assistance Sitting-balance support: No upper extremity supported;Feet supported Sitting balance-Leahy Scale: Poor Sitting balance - Comments: closeguarding able to sit at EOB without physical assist x 5 minutes     Standing balance-Leahy Scale: Poor Standing balance comment: reliant on external support and BUE support                            Cognition Arousal/Alertness: Awake/alert Behavior During Therapy: WFL for tasks assessed/performed Overall Cognitive Status: Difficult to assess                   Orientation Level: Disoriented to;Place;Time;Situation (NT other than person) Current Attention Level: Sustained   Following Commands: Follows one step commands consistently;Follows one step commands with increased time Safety/Judgement: Decreased awareness of deficits Awareness:  (could not state she had a stroke that made her weak) Problem Solving:  Slow processing;Decreased initiation;Difficulty sequencing;Requires verbal cues;Requires tactile cues General Comments: hx of dementia. speech garbled and at times difficult to understand      Exercises      General Comments General comments (skin integrity, edema, etc.): Family not present. Message received from MD that family requesting PT reassess if appropriate for CIR.  Called daughter, Kristin Briggs, after session to update her on pt's progress      Pertinent Vitals/Pain Pain Assessment: Faces Faces Pain Scale: No hurt    Home Living                      Prior Function            PT Goals (current goals can now be found in the care plan section) Acute Rehab PT Goals Patient Stated Goal: none stated Time For Goal Achievement: 03/07/21 Potential to Achieve Goals: Fair Progress towards PT goals: Progressing toward goals    Frequency    Min 4X/week      PT Plan Discharge plan needs to be updated;Frequency needs to be updated    Co-evaluation              AM-PAC PT "6 Clicks" Mobility   Outcome Measure  Help needed turning from your back to your side while in a flat bed without using bedrails?: A Lot Help needed moving from lying on your back to sitting on the side of a flat bed without using bedrails?: A Lot Help needed moving to and from a bed to a chair (including a wheelchair)?: Total Help needed standing up from a chair using your arms (e.g., wheelchair or bedside chair)?: Total Help needed to walk in hospital room?: Total Help needed climbing 3-5 steps with a railing? : Total 6 Click Score: 8    End of Session Equipment Utilized During Treatment: Gait belt Activity Tolerance: Patient tolerated treatment well Patient left: in bed;with call bell/phone within reach;with bed alarm set Nurse Communication: Mobility status (NT reported pt has transferred multiple times today with stedy with nursing (chair, BSC x 2)) PT Visit Diagnosis: Other abnormalities of gait and mobility (R26.89);Other symptoms and signs involving the nervous system (R29.898);Muscle weakness (generalized) (M62.81)     Time: 6381-7711 PT Time Calculation (min) (ACUTE ONLY): 19 min  Charges:  $Therapeutic Activity: 8-22 mins                      Jerolyn Center, PT Pager [phone]    Zena Amos 02/24/2021, 4:06 PM

## 2021-02-24 NOTE — Progress Notes (Addendum)
Spoke with [employer] in PT. Patient to be evaluated today or tomorrow and they will contact the daughter at that time. Daughter of patient Kristin Briggs) made aware.   Lavonda Jumbo, DO 02/24/2021, 2:01 PM PGY-2, [facility]

## 2021-02-24 NOTE — Progress Notes (Signed)
Palliative Medicine Inpatient Follow Up Note  Reason for consult:  "Goals of Care Discussion with family, concern for worsening of condition"   HPI:  Per intake H&P --> [name] is a 87 y.o. female with history of dementia, heart failure (EF 20%), colon abrnormality who present with aphasia, RUE incoordination and weakness and mild dysarthria.  Kristin Briggs suffered an acute ischemic stroke.   Palliative care was asked to discuss goals of care in the setting of progressive dementia and concern for recurrent aspirational events.  Today's Discussion (02/24/2021):  *Please note that this is a verbal dictation therefore any spelling or grammatical errors are due to the "[place] [employer]" system interpretation.  Chart reviewed.  I with patient's bedside nurse, McKenzie this morning.  We discussed how Kristin Briggs's appetite has increased over the last 2 days.  We made the decision to attempt to get Kristin Briggs out of bed which we were able to do successfully.  There is some discoordination on the right side.  Discussed with the family medicine service the plan for a meeting at 11 AM.  Questions and concerns addressed  ___________________________________________________________________________________ Family Meeting (11AM)  Participants Present: Kristin Briggs (daughter), Kristin Briggs (granddaughter), Kristin Briggs, and two other relatives  Providers Present: Dr. Janus Molder, Jone Baseman, & Tacey Ruiz  Met at bedside.  Reviewed patient's past medical history inclusive of her dementia and heart failure.  Discussed tell the patient was mobilizing prior to admission.  Discussed her acute medical illnesses inclusive of a left MCA.   Patient's RN, McKenzie started by reviewing the progress [name] has made over the past 3 days that she has been caring for her.  McKenzie shares that [name] has been more alert and aware today.  She has more strength on her right side than she has had on  prior days and her speech has become clearer.  I was able to answer questions as they relate to outpatient palliative care and the potential for hospice care.  We have a plan in place for patient to required skilled care ideally to the point where her daughter, Kristin Briggs is able to resume the role of being her primary caregiver.  We reviewed the hope for improvement.  I shared that Kristin Briggs may be at a new baseline after undergoing a CVA.  I shared that this should not be inhibitory in terms of her continued rehab.  Nursing staff and I were able to mobilize Kristin Briggs well in the room so family can better gauge her progress.  She was suctioned multiple times while present in the room.  Dr. Janus Molder reviews patient's medical history.  And discusses the hope for patient to go to acute rehabilitation.  Although prior patient had been refuted from this it appears that she has made progress in the prior few days and it would be reasonable for this to be a reconsideration.  The difference was between skilled nursing and acute care nursing centers were described and explained in great detail.  Family support provided.  Objective Assessment: Vital Signs Vitals:   02/24/21 0420 02/24/21 0720  BP: (!) 154/57 (!) 155/58  Pulse: 70 75  Resp: 13 15  Temp: 98.4 F (36.9 C) 98.3 F (36.8 C)  SpO2: 94% 94%    Intake/Output Summary (Last 24 hours) at 02/24/2021 1035 Last data filed at 02/24/2021 0854 Gross per 24 hour  Intake 1438 ml  Output 700 ml  Net 738 ml   Last Weight  Most recent update: 02/21/2021  5:05  AM    Weight  56 kg (123 lb 7.3 oz)            Gen: Elderly female HEENT: moist mucous membranes CV: Irregular rate and rhythm PULM: On RA, intermittent coughing ABD: soft/nontender EXT: No edema Neuro: Awake, alert   SUMMARY OF RECOMMENDATIONS   DNAR/DNI    MOST completed and placed on chart   Family goals are for patient to go to rehabilitation and gain strength ideally at CIR if  possible  Will request OP Palliative support   Ongoing palliative medicine team support  Time Spent: 107 minutes Greater than 50% of the time was spent in counseling and coordination of care ______________________________________________________________________________________ [place] Team Team Cell Phone: [phone] Please utilize secure chat with additional questions, if there is no response within 30 minutes please call the above phone number  Palliative Medicine Team providers are available by phone from 7am to 7pm daily and can be reached through the team cell phone.  Should this patient require assistance outside of these hours, please call the patient's attending physician.

## 2021-02-24 NOTE — Progress Notes (Addendum)
FPTS Interim Progress Note Attended family meeting today. There were 5 family members present and 2 family member joining via telephone; Lamarr Lulas Palliative NP presents as well. Bulk of conversation directed by family and their desire for her to have the best possible chance at regaining more function to be able to perform ADLs with assistance. Prior to choosing a SNF they desire to have Ms. Salsbury re-evaluated for CIR since she has made some improvement since admission. This is a fair request but also discussed with family to continue to view SNF list beyond her re-evaluation for CIR in case she continues to not be a candidate. Will touch base with PT for patient to be re-evaluated per family's request.   Of note, patient was verbal this morning. Was able to tell me that her family was coming this morning prior to rounding. She recognized me when I walked in the room. She follows commands and has some movement in there right extremity. She does have global weakness. During meeting desired to use commode; does not prefer pure wick apparatus.    -Appreciate PT/OT recs -Appreciate palliative recs  Autry-Lott, Randa Evens, DO 02/24/2021, 12:22 PM PGY-2, [facility] Family Medicine Service pager [phone]

## 2021-02-24 NOTE — Progress Notes (Signed)
Pt currently in the chair eating breakfast. Spoke with the primary Rn and instructed that once the pt is ba k in bed to reconsult and IVT will come see the pt for PIV placement. Verbalized understanding and in agreement.

## 2021-02-24 NOTE — Progress Notes (Signed)
FPTS Interim Progress Note  Patient sleeping and resting comfortably.  Rounded with primary RN.  No concerns voiced.  No orders required.  Appreciated nightly round.  Today's Vitals   02/23/21 1617 02/23/21 2009 02/23/21 2030 02/23/21 2333  BP: (!) 150/55 (!) 145/52  (!) 147/48  Pulse: 78 72  83  Resp: 18 15  14   Temp: 98.3 F (36.8 C) 98 F (36.7 C)  98.3 F (36.8 C)  TempSrc: Axillary Axillary  Axillary  SpO2: 93% 92%  95%  Weight:      Height:      PainSc:   0-No pain      , MD 02/24/2021, 1:52 AM PGY-2, [facility] Family Medicine Service pager [phone]

## 2021-02-24 NOTE — Progress Notes (Signed)
Family Medicine Teaching Service Daily Progress Note Intern Pager: [phone]  Patient name: Kristin Briggs Medical record number: [id number] Date of birth: 1920-04-21 Age: 87 y.o. Gender: female  Primary Care Provider: Pcp, No Consultants: Neuro, Palliative Code Status: DNR  Pt Overview and Major Events to Date:  6/23- Admitted, Neurology consulted 6/24- Neuro signed off, Palliative consulted 6/26- GOC conversation  Assessment and Plan: [name] is a 87 year old female presented with dysarthria, found to have acute ischemic stroke.  PMH is significant for colon abnormality.   Acute ischemic stroke (Left MCA) Continue to have dysarthria and right sided weakness. This morning transferred from bed to chair with assistance. Able to articulate that her family is coming today.  - Continue ASA and Lipitor daily - GOC discussion with daughter regarding further management and dispo - PT/OT : SNF - SLP: Regular thin liquid diet, consider RD consult   CKD stage IIIA Cr- 0.98 (no longer AKI). Has increased fluid intake. UOP 700.   - Strict I's/O's.  Poor PO intake CBG 68 overnight. Started D5 in IVF. Nursing staff this morning: IV leaking overnight, IV team called this morning to replace. Patient eating now.  - Repeat CBG now -Will consult RD for calorie count and nutrition requirements - Consider removing D5NS 61mL/hr if diet improves   Dementia - Family meeting on today (6/26) to determine GOC.  - Palliative Care on board appreciate recommendations    FEN/GI: Heart healthy diet PPx: Lovenox Dispo: Home v. SNF v. CIR  in 2-3 days. Barriers include GOC conversation.   Subjective:  No acute events. States she is feeling ok.   Objective: Temp:  [97.5 F (36.4 C)-98.4 F (36.9 C)] 98.3 F (36.8 C) (06/26 0720) Pulse Rate:  [63-83] 75 (06/26 0720) Resp:  [13-18] 15 (06/26 0720) BP: (132-155)/(48-58) 155/58 (06/26 0720) SpO2:  [92 %-95 %] 94 % (06/26 0720) Physical  Exam: General: Resting comfortably in bed. No acute distress.  Cardiovascular: RRR. Normal heart sounds.  Respiratory: CTAB. Normal effort.  Abdomen: Soft non tender. No guarding. No masses.  Extremities: No LE edema. Globally weak.   Laboratory: Recent Labs  Lab 02/21/21 1010 02/22/21 0228 02/23/21 0128  WBC 7.9 9.1 7.3  HGB 13.0 13.3 11.6*  HCT 41.7 41.9 36.9  PLT 197 204 184   Recent Labs  Lab 02/20/21 1315 02/20/21 1323 02/22/21 0228 02/23/21 0128 02/24/21 0112  NA 140   < > 140 141 142  K 4.0   < > 3.9 3.8 4.0  CL 107   < > 103 109 111  CO2 24   < > 20* 22 22  BUN 14   < > 17 18 19   CREATININE 1.25*   < > 1.26* 1.12* 0.98  CALCIUM 9.2   < > 9.7 8.6* 9.0  PROT 6.1*  --   --   --   --   BILITOT 0.9  --   --   --   --   ALKPHOS 62  --   --   --   --   ALT 10  --   --   --   --   AST 22  --   --   --   --   GLUCOSE 98   < > 113* 65* 68*   < > = values in this interval not displayed.    Imaging/Diagnostic Tests: No new imaging  , DO 02/24/2021, 7:44 AM PGY-2, [place] [facility] FPTS Intern pager:  [phone], text pages welcome

## 2021-02-25 MED ORDER — DIGOXIN 0.25 MG/ML IJ SOLN
0.1250 mg | Freq: Three times a day (TID) | INTRAMUSCULAR | Status: AC
  Administered 2021-02-25 – 2021-02-26 (×2): 0.125 mg via INTRAVENOUS
  Filled 2021-02-25 (×2): qty 2

## 2021-02-25 MED ORDER — DILTIAZEM HCL 25 MG/5ML IV SOLN
10.0000 mg | Freq: Once | INTRAVENOUS | Status: AC
  Administered 2021-02-25: 10 mg via INTRAVENOUS
  Filled 2021-02-25: qty 5

## 2021-02-25 MED ORDER — QUETIAPINE FUMARATE 25 MG PO TABS
25.0000 mg | ORAL_TABLET | Freq: Once | ORAL | Status: AC
  Administered 2021-02-25: 25 mg via ORAL
  Filled 2021-02-25: qty 1

## 2021-02-25 MED ORDER — DILTIAZEM HCL 60 MG PO TABS
30.0000 mg | ORAL_TABLET | Freq: Once | ORAL | Status: AC
  Administered 2021-02-25: 30 mg via ORAL
  Filled 2021-02-25: qty 1

## 2021-02-25 MED ORDER — DIGOXIN 0.25 MG/ML IJ SOLN
0.2500 mg | Freq: Once | INTRAMUSCULAR | Status: AC
  Administered 2021-02-25: 0.25 mg via INTRAVENOUS
  Filled 2021-02-25: qty 2

## 2021-02-25 MED ORDER — ENSURE ENLIVE PO LIQD
237.0000 mL | Freq: Three times a day (TID) | ORAL | Status: DC
  Administered 2021-02-26 – 2021-02-28 (×7): 237 mL via ORAL
  Filled 2021-02-25 (×2): qty 237

## 2021-02-25 MED ORDER — SODIUM CHLORIDE 0.9 % IV BOLUS
250.0000 mL | Freq: Once | INTRAVENOUS | Status: AC
  Administered 2021-02-25: 250 mL via INTRAVENOUS

## 2021-02-25 MED ORDER — ADULT MULTIVITAMIN W/MINERALS CH
1.0000 | ORAL_TABLET | Freq: Every day | ORAL | Status: DC
  Administered 2021-02-25 – 2021-02-28 (×4): 1 via ORAL
  Filled 2021-02-25 (×4): qty 1

## 2021-02-25 NOTE — Plan of Care (Signed)
  Problem: Clinical Measurements: Goal: Diagnostic test results will improve Outcome: Progressing   Problem: Activity: Goal: Risk for activity intolerance will decrease Outcome: Progressing   Problem: Safety: Goal: Ability to remain free from injury will improve Outcome: Progressing   

## 2021-02-25 NOTE — Progress Notes (Signed)
SLP Cancellation Note  Patient Details Name: Kristin Briggs MRN: [id number] DOB: 30-Apr-1920   Cancelled treatment:       Reason Eval/Treat Not Completed: Medical issues which prohibited therapy;Fatigue/lethargy limiting ability to participate. Per RN, HR still sustaining in the 140s/150s and pt very lethargic. Daughter requested SLP f/u at later date. Will follow.    Avie Echevaria, MA, CCC-SLP Acute Rehabilitation Services Office Number: [phone]  Paulette Blanch 02/25/2021, 3:37 PM

## 2021-02-25 NOTE — Progress Notes (Signed)
Attempted to contact Dr. Deirdre Priest at this time in reference to patients heart rate sustaining in the 150's and at times in the 180-190. Left message for him to call me back. Will await MD instruction.

## 2021-02-25 NOTE — Progress Notes (Addendum)
FPTS Interim Progress Note  S: Patient denies pain. Primary RN at bedside.   O: BP (!) 116/92 (BP Location: Left Arm)   Pulse (!) 122   Temp 98 F (36.7 C) (Axillary)   Resp 20   Ht 5' (1.524 m)   Wt 56 kg   SpO2 95%   BMI 24.11 kg/m    GEN: alert, resting in bed  CVS: tachycardic, irregularly irregular rhythm RESP: no increased work of breathing, clear lungs  SKIN: warm, dry    A/P: Atrial Fibrillation  Not rate controlled. HR 130-140s. Given 30 mg dilt. Monitor BP  Continue digoxin.     Prolonged QTc Qtc 511 on monitor Mg in AM  K>4, Mg>2  Katha Cabal, DO 02/25/2021, 3:31 PM PGY-2, [facility] Family Medicine Service pager [phone]

## 2021-02-25 NOTE — Progress Notes (Signed)
Inpatient Rehab Admissions Coordinator:   Consult received.  Note that pt seems to be fluctuating, unable to participate in therapy today due to medical issues and work up pending.  I will continue to follow and see how she does over the next few days and will try to meet with family tomorrow if she remains fairly stable.    Estill Dooms, PT, DPT Admissions Coordinator [phone] 02/25/21  1:59 PM

## 2021-02-25 NOTE — Progress Notes (Signed)
Received page about patient tachycardia on montior of 140's-170's.  Called and indicated looked to be having some runs of V Tach and hypotension as well.  Manual BP found to be 78/58.  Patient looks moire uncomfortable but responding to commands and appropriately responding to questions.  Ordered dose of IV Diltiazem 10 mg and bolus dose of IV fluids.  BP came up to 93/56 and HR came down to 80's-110's.  Reached out to patient's daughter and gave update.  Patient's daughter indicated she would come by this morning.  Re-ordered Tele as well.   Jovita Kussmaul, MD 02/25/2021, 7:41 AM PGY-1, [facility] Family Medicine Service pager [phone]

## 2021-02-25 NOTE — Progress Notes (Signed)
PT Cancellation Note  Patient Details Name: KALESE ENSZ MRN: [id number] DOB: Nov 23, 1919   Cancelled Treatment:    Reason Eval/Treat Not Completed: Medical issues which prohibited therapy  Per nursing notes, HR still sustaining in the 150s and at times 180s-190s. Will hold PT for today and f/u next date of service.   Madelaine Etienne, DPT, PN1   Supplemental Physical Therapist [facility]    Pager [phone] Acute Rehab Office [phone]

## 2021-02-25 NOTE — Progress Notes (Signed)
This chaplain responded to PMT referral to spiritual care check in with the Pt. and family.   The chaplain understands the chaplain's visit and communication with the PMT is appreciated by the Pt. daughter-Marsha.  [name] updates the chaplain the Pt. is experiencing some Afib this morning.   Both [name] and the chaplain agree to the importance of Pt. rest; the chaplain will return as needed for F/U spiritual care.

## 2021-02-25 NOTE — Progress Notes (Signed)
Initial Nutrition Assessment  DOCUMENTATION CODES:   Not applicable  INTERVENTION:   -Ensure Enlive po TID, each supplement provides 350 kcal and 20 grams of protein  -Carnation Instant Breakfast TID with meals -MVI with minerals daily -Liberalize diet to regular  NUTRITION DIAGNOSIS:   Inadequate oral intake related to decreased appetite as evidenced by per patient/family report.  GOAL:   Patient will meet greater than or equal to 90% of their needs  MONITOR:   PO intake, Supplement acceptance, Labs, Weight trends, Skin, I & O's  REASON FOR ASSESSMENT:   Consult Assessment of nutrition requirement/status  ASSESSMENT:   Kristin Briggs is a 87 y.o. female presenting with dysarthria, found to have code stroke. PMH is significant for colon abnormality (?diverticulitis).  Pt admitted with CVA.  Reviewed I/O's: +355 ml x 24 hours and +2.1 L since admission  Pt very somnolent at time of visit, but open her eyes a few times (when RD said name and mentioned her birthday).   History obtained from daughter at bedside. Pt usually consumes 3 meals per day- pt family cooks a wide variety of foods and intake is inconsistent at baseline. Pt will sometimes eat a few bites of each item from each meal or eat 100% of meals on a good day. Daughter shares that pt was very alert yesterday and consumed large amount of outside food. However, meal completion has been minimal secondary to lethargy. Noted meal completions 10-50%. Observed breakfast tray- pt consumed about 25% of pancake and a few bites of applesauce.   Pt daughter noticed progressive wt loss over the past 2 years. She used to wear a 14-16 dress size and is now down to a 10-12 dress size. They have noticed her clothing fitting looser. No wt hx available to assess at this time.   Per daughter, pt often drinks a [employer] daily. She is also interested in trying oral nutrition supplements.    Breakfast: 30 kcals, 1  gram protein Lunch: 326 kcals, 11 grams protein Dinner: 190 kcals, 0 grams protein  Total intake: 546 kcal (39% of minimum estimated needs)  12 grams protein (20% of minimum estimated needs)  Labs reviewed.    NUTRITION - FOCUSED PHYSICAL EXAM:  Flowsheet Row Most Recent Value  Orbital Region No depletion  Upper Arm Region Mild depletion  Thoracic and Lumbar Region No depletion  Buccal Region No depletion  Temple Region No depletion  Clavicle Bone Region No depletion  Clavicle and Acromion Bone Region No depletion  Scapular Bone Region No depletion  Dorsal Hand No depletion  Patellar Region Mild depletion  Anterior Thigh Region Mild depletion  Posterior Calf Region Mild depletion  Edema (RD Assessment) Mild  Hair Reviewed  Eyes Reviewed  Mouth Reviewed  Skin Reviewed  Nails Reviewed       Diet Order:   Diet Order             Diet Heart Room service appropriate? No; Fluid consistency: Thin  Diet effective now                   EDUCATION NEEDS:   Education needs have been addressed  Skin:  Skin Assessment: Reviewed RN Assessment  Last BM:  02/23/21  Height:   Ht Readings from Last 1 Encounters:  02/21/21 5' (1.524 m)    Weight:   Wt Readings from Last 1 Encounters:  02/21/21 56 kg    Ideal Body Weight:  45.5 kg  BMI:  Body mass index is 24.11 kg/m.  Estimated Nutritional Needs:   Kcal:  1400-1600  Protein:  60-75 grams  Fluid:  > 1.4 L    Levada Schilling, RD, LDN, CDCES Registered Dietitian II Certified Diabetes Care and Education Specialist Please refer to [facility] for RD and/or RD on-call/weekend/after hours pager

## 2021-02-25 NOTE — Progress Notes (Signed)
FPTS Interim Progress Note  S:  Patient resting comfortably in bed.    O: BP 133/78 (BP Location: Left Arm)   Pulse 78   Temp 98.1 F (36.7 C) (Axillary)   Resp 14   Ht 5' (1.524 m)   Wt 56 kg   SpO2 93%   BMI 24.11 kg/m    Physical Exam Constitutional:      Appearance: Normal appearance.  HENT:     Head: Normocephalic and atraumatic.     Mouth/Throat:     Mouth: Mucous membranes are moist.  Cardiovascular:     Rate and Rhythm: Normal rate and regular rhythm.  Pulmonary:     Effort: Pulmonary effort is normal.     Breath sounds: Normal breath sounds.  Abdominal:     General: Abdomen is flat.     Palpations: Abdomen is soft.  Skin:    General: Skin is warm.  Neurological:     Mental Status: She is alert.     Comments: Patient only responding to some commands, unable to assess full neurological exam     A/P: Patient heart rate normal and normotensive.  Received evening Digoxin dose.  Will continue to monitor throughout night.  Plan to give low dose benzo if agitated/not redirectable.  Jovita Kussmaul, MD 02/25/2021, 10:23 PM PGY-1, [facility] Family Medicine Service pager [phone]

## 2021-02-25 NOTE — Progress Notes (Signed)
Family Medicine Teaching Service Daily Progress Note Intern Pager: [phone]  Patient name: Kristin Briggs Medical record number: [id number] Date of birth: 05-17-1920 Age: 87 y.o. Gender: female  Primary Care Provider: Pcp, No Consultants: Palliative, neuro (s/o) Code Status: DNR  Pt Overview and Major Events to Date:  6/23: admitted, neuro consulted 6/24: neuro signed off, palliative consulted  Kristin Briggs is a 87 y.o. female who presented with dysarthria and was found to have acute ischemic stroke. PMH significant for colon abnormality and dementia.  Assessment and Plan:  A-fib with RVR Patient went into a-fib with RVR this morning. Received 10mg  IV diltiazem x1 with very temporary improvement in HR to 90s. Now remains in a-fib with RVR (HR 120s-150s). Mentating at baseline. -Will load with digoxin per Dr. -Continue tele monitoring  Acute Ischemic Stroke involving L MCA Still without any meaningful function in right upper extremity. Speech unintelligible today. -ASA and Lipitor -PT/OT -CIR re-eval pending improvement of a-fib -Palliative following, appreciate recommendations  Dementia Patient with confusion and agitation overnight, requiring additional dose of Seroquel. -d/c Seroquel due to QT prolongation -consider low dose benzodiazepine prn at night  FEN/GI: Heart healthy diet PPx: Lovenox Dispo: CIR vs SNF  in 2-3 days. Barriers include a-fib with RVR.   Subjective:  Patient alert, able to state name although remainder of speech unintelligible.  Objective: Temp:  [97.7 F (36.5 C)-98.3 F (36.8 C)] 98.2 F (36.8 C) (06/27 0421) Pulse Rate:  [75-145] 81 (06/27 0610) Resp:  [15-20] 19 (06/27 0610) BP: (78-160)/(58-91) 78/58 (06/27 0610) SpO2:  [93 %-96 %] 95 % (06/27 0610) Physical Exam: General: alert, NAD Cardiovascular: irregularly irregular rhythm, tachycardic Respiratory: normal effort Abdomen: soft, nontender Extremities: no peripheral  edema Neuro: 0/5 strength in R upper extremity, speech garbled Remainder of neuro exam overall unremarkable  Laboratory: Recent Labs  Lab 02/21/21 1010 02/22/21 0228 02/23/21 0128  WBC 7.9 9.1 7.3  HGB 13.0 13.3 11.6*  HCT 41.7 41.9 36.9  PLT 197 204 184   Recent Labs  Lab 02/20/21 1315 02/20/21 1323 02/22/21 0228 02/23/21 0128 02/24/21 0112  NA 140   < > 140 141 142  K 4.0   < > 3.9 3.8 4.0  CL 107   < > 103 109 111  CO2 24   < > 20* 22 22  BUN 14   < > 17 18 19   CREATININE 1.25*   < > 1.26* 1.12* 0.98  CALCIUM 9.2   < > 9.7 8.6* 9.0  PROT 6.1*  --   --   --   --   BILITOT 0.9  --   --   --   --   ALKPHOS 62  --   --   --   --   ALT 10  --   --   --   --   AST 22  --   --   --   --   GLUCOSE 98   < > 113* 65* 68*   < > = values in this interval not displayed.    Imaging/Diagnostic Tests: No results found.   02/26/21, MD 02/25/2021, 6:26 AM PGY-1, [facility] Family Medicine FPTS Intern pager: [phone], text pages welcome

## 2021-02-25 NOTE — Progress Notes (Addendum)
MEDICATION RELATED CONSULT NOTE - INITIAL   Pharmacy Consult for Digoxin Dosing Indication: New onset Atrial Fibrillation  No Known Allergies  Patient Measurements: Height: 5' (152.4 cm) Weight: 56 kg (123 lb 7.3 oz) IBW/kg (Calculated) : 45.5  Vital Signs: Temp: 98 F (36.7 C) (06/27 1220) Temp Source: Axillary (06/27 1220) BP: 116/92 (06/27 1220) Pulse Rate: 122 (06/27 1220)  Labs: Recent Labs    02/23/21 0128 02/24/21 0112  WBC 7.3  --   HGB 11.6*  --   HCT 36.9  --   PLT 184  --   CREATININE 1.12* 0.98   Estimated Creatinine Clearance: 25.1 mL/min (by C-G formula based on SCr of 0.98 mg/dL).  Medical History: Past Medical History:  Diagnosis Date   Cervical osteoarthritis    Chronic diastolic heart failure (HCC)    Colon abnormality    Inflammed, Per [facility] New Patient Packet   Colonic diverticular abscess    Dementia (HCC)    Dyspnea on exertion    Edema    High cholesterol    Hypertension    Impacted cerumen    Mild cognitive impairment    PVC (premature ventricular contraction)    Systolic murmur     Medications:  No medications prior to admission.   Scheduled:   aspirin EC  81 mg Oral Daily   atorvastatin  40 mg Oral Daily   digoxin  0.25 mg Intravenous Once   Followed by   digoxin  0.125 mg Intravenous Q8H   enoxaparin (LOVENOX) injection  30 mg Subcutaneous Q24H   sodium chloride flush  3 mL Intravenous Once    Assessment: Pt went into a-fib with RVR 6/27. Received 10 mg IV diltiazem x1. Remains in a-fib RVR. HR elevated (120s-140s) BP low (90s-110s). K normal (4). Mentating at baseline.  Goal of Therapy:  Desired serum concentration is <1.0 ng/mL Controlled HR  Plan:  Digoxin LD: 250 mcg @ 1345 6/27 x1; 125 mcg @ 2200 6/27 x1; 125 mcg @ 0600 6/28 x1 Digoxin MD:  62.5 mcg Every other Day starting 6/29  Consider Digoxin serum level at steady state Continue Tele monitoring Trend renal function, Mg, K, CBC Monitor for Digoxin  Toxicity: bradycardia, poor mentation, hypoperfusion, alterations in vision    Feliberto Gottron, PharmD Candidate 02/25/2021,12:54 PM

## 2021-02-25 NOTE — Progress Notes (Signed)
OT Cancellation Note  Patient Details Name: LEAFY MOTSINGER MRN: [id number] DOB: April 16, 1920   Cancelled Treatment:    Reason Eval/Treat Not Completed: Medical issues which prohibited therapy. Attempting to see pt, however, nursing reporting HR in the 150s-180s supine in bed. Attempting a second time to see pt at 1430, and HR in the 130's supine; nursing requesting hold therapy. Will continue to follow as medically appropriate.    Ladene Artist, OTDS   Ladene Artist 02/25/2021, 2:35 PM

## 2021-02-25 NOTE — Progress Notes (Signed)
FPTS Interim Progress Note Phoned inpatient rehab admission coordinator Vernona Rieger to voice concern about CIR evaluation today. Patient does not appear to feel well this morning. Also having new onset afib. We will need to continue our work up for this. I am concerned that if evaluated for CIR today her current state of feeling unwell could potential negatively affect the progress she has made since admission for acute stroke. She is currently not medically stable.   Lavonda Jumbo, DO 02/25/2021, 8:43 AM PGY-2, [facility] Family Medicine Service pager [phone]

## 2021-02-25 NOTE — Progress Notes (Signed)
Pt noted to be restless/attempting to climb OOB at various times during the night. At approximately 5 minutes till 0600, pt HR noted to be in the 130's-140's per nursing tech report; upon arrival to pt's room, HR was noted to be rapid and erratic, on call providers notified. Heart rhythm appeared to be fluctuating between a wide QRS tachycardia as observed on EKG and a-fib/RVR. VS obtained, (see flowsheet), and BP noted to be 96/58, and when rechecked after that pt was in the 70's systolic. Pt was alert throughout this time, and repeatedly denied c/o CP, palpitations, dizziness/lightheadedness, or SOB. BP checked manually and was noted to be 78/58. EKG obtained, and on call providers arrived to pt's room to assess. Orders received at this time, and a bolus was administered , along with 10mg  IV diltiazem, which improved HR almost immediately. Pt continued to deny any c/o CP, SOB, or any other symptoms throughout this event. On call provider stated he called pt's daughter to notify her of pt's change in condition at that time, and that pt's daughter would come to see pt within the next half hour. Pt observed for approximately 15-20 minutes without significant changes to VS noted, and pt's daughter arrived at approximately [phone] and was informed of the above events by night shift RN. BP at this time remained on low side but stable (92/53), and HR continued to fluctuate as pt rhythm still observed to be a-fib, but at a much lower rate (90's-120's). Day RN given detailed report at this time, and assumed care for pt.

## 2021-02-25 NOTE — Progress Notes (Signed)
FPTS Interim Progress Note  S: Received page with concern for patient's safety due to agitation and confusion.  Reported to bedside in order to evaluate patient and discuss further with RN.  RN states that patient was positioned as a trauma to get out of bed when she went to evaluate her earlier during the shift.  She states that patient is usually calm as long as RN is at bedside.  RN reports Patient 25 mg dose of Seroquel last night and patient slept well however tonight, patient continues to become confused and has not been sleeping.  Patient is alert, responds negatively to questions about pain.  Patient oriented to self and location.  O: BP (!) 155/89 (BP Location: Left Arm)   Pulse 79   Temp 98.1 F (36.7 C) (Axillary)   Resp 18   Ht 5' (1.524 m)   Wt 56 kg   SpO2 95%   BMI 24.11 kg/m   General: Elderly female lying supine in the in no acute distress on room air Cardiac: Systolic murmur, QTC on monitor elevated at 509 Respiratory: Normal work of breathing, stable on room air Extremities: Ecchymosis or edema  A/P: Patient is a 87 year old female admitted for CVA, currently stable from neurological standpoint experiencing nighttime confusion.  Patient does not appear to be immediate danger to herself or staff at this time.  Discussed recommendation to add 25 mg dose of Seroquel once in addition to 25 mg of Seroquel received earlier this evening.  Nursing was in agreement with this plan. -Ordered 25 mg Seroquel x1  Simmons-Robinson, Tawanna Cooler, MD 02/25/2021, 3:04 AM PGY-2, [facility] Family Medicine Service pager [phone]

## 2021-02-25 NOTE — Progress Notes (Signed)
   02/25/21 0756  Assess: MEWS Score  Temp (!) 97.5 F (36.4 C)  BP (!) 107/52  Pulse Rate 78  ECG Heart Rate (!) 115  Resp 18  Level of Consciousness Alert  SpO2 96 %  O2 Device Nasal Cannula  O2 Flow Rate (L/min) 2 L/min  Assess: MEWS Score  MEWS Temp 0  MEWS Systolic 0  MEWS Pulse 2  MEWS RR 0  MEWS LOC 0  MEWS Score 2  MEWS Score Color Yellow  Assess: if the MEWS score is Yellow or Red  Were vital signs taken at a resting state? Yes  Focused Assessment No change from prior assessment  Early Detection of Sepsis Score *See Row Information* Low  MEWS guidelines implemented *See Row Information* No, previously yellow, continue vital signs every 4 hours  Treat  MEWS Interventions Administered scheduled meds/treatments  Pain Scale 0-10  Pain Score 0  Take Vital Signs  Increase Vital Sign Frequency  Yellow: Q 2hr X 2 then Q 4hr X 2, if remains yellow, continue Q 4hrs

## 2021-02-26 DIAGNOSIS — R531 Weakness: Secondary | ICD-10-CM

## 2021-02-26 LAB — BASIC METABOLIC PANEL
Anion gap: 9 (ref 5–15)
BUN: 13 mg/dL (ref 8–23)
CO2: 25 mmol/L (ref 22–32)
Calcium: 9.3 mg/dL (ref 8.9–10.3)
Chloride: 109 mmol/L (ref 98–111)
Creatinine, Ser: 1 mg/dL (ref 0.44–1.00)
GFR, Estimated: 51 mL/min — ABNORMAL LOW (ref >60)
Glucose, Bld: 80 mg/dL (ref 70–99)
Potassium: 3.9 mmol/L (ref 3.5–5.1)
Sodium: 143 mmol/L (ref 135–145)

## 2021-02-26 LAB — MAGNESIUM: Magnesium: 1.8 mg/dL (ref 1.7–2.4)

## 2021-02-26 MED ORDER — DIGOXIN 125 MCG PO TABS
0.0625 mg | ORAL_TABLET | ORAL | Status: DC
  Administered 2021-02-27: 0.0625 mg via ORAL
  Filled 2021-02-26 (×2): qty 1

## 2021-02-26 NOTE — TOC Progression Note (Signed)
Transition of Care [facility]) - Progression Note    Patient Details  Name: Kristin Briggs MRN: [id number] Date of Birth: May 03, 1920  [facility] of [facility]) CM/SW Contact  Mearl Latin, LCSW Phone Number: 02/26/2021, 4:54 PM  Clinical Narrative:    CSW spoke with patient's daughter regarding CIR denial. She reported understanding and is going to tour SNF facilities ([facility] and [facility]).    Expected Discharge Plan: Skilled Nursing Facility Barriers to Discharge: SNF Pending bed offer  Expected Discharge Plan and Services Expected Discharge Plan: Skilled Nursing Facility In-house Referral: Clinical Social Work   Post Acute Care Choice: Skilled Nursing Facility Living arrangements for the past 2 months: Single Family Home                                       Social Determinants of Health (SDOH) Interventions    Readmission Risk Interventions No flowsheet data found.

## 2021-02-26 NOTE — Progress Notes (Signed)
Occupational Therapy Treatment Patient Details Name: Kristin Briggs MRN: [id number] DOB: 1919-12-30 Today's Date: 02/26/2021    History of present illness Pt is a 87 y.o. female admitted 02/20/21 as code stroke with dysarthria, LKW time on 6/21 at 2330. MRI showed acute infarct in posterior L frontal lobe; subacute L occipital infarct; punctate acute/subacute L frontoparietal and R frontal infarcts; several chronic infarcts, microvascular ischemic changes. PMH includes CKD, dementia.   OT comments  Patient progressing and showed improved EOB sitting balance during which pt performed RUE Weight bearing with ability to observe elbow extension to support trunk x 3 reps but quick to fatigue, compared to previous session. Patient remains limited by RT sided hemiparesis vs hemiplegia, anxiety/fear of falling, and decreased activity tolerance with increased HR to 122 with EOB sitting, along with deficits noted below. Pt continues to demonstrate good rehab potential and would benefit from continued skilled OT to increase safety and independence with ADLs and functional transfers to allow pt to return home safely and reduce caregiver burden and fall risk.   Follow Up Recommendations  SNF;Supervision/Assistance - 24 hour    Equipment Recommendations       Recommendations for Other Services      Precautions / Restrictions Precautions Precautions: Fall Precaution Comments: decrease in motor control of R hand, inattention to R UE, L inattention, very anxious with mobility Restrictions Weight Bearing Restrictions: No Other Position/Activity Restrictions: RT hemiplegia       Mobility Bed Mobility Overal bed mobility: Needs Assistance Bed Mobility: Rolling;Sidelying to Sit;Sit to Sidelying     Supine to sit: Mod assist;HOB elevated     General bed mobility comments: able to get to EOB with ModAx1, increaed time and mulitmodal cues for sequencing, HOB elevated and increased. Second person at  distant min guard level for safety but not really needed    Transfers Overall transfer level: Needs assistance Equipment used: Ambulation equipment used Transfers: Sit to/from [employer] Sit to Stand: Max assist;+2 physical assistance Stand pivot transfers: Total assist;+2 physical assistance       General transfer comment: attempted standing both with RW and stedy- needed MaxAx2 for both techniques, but much more safe in stedy. Very anxious. Heavy cues for hand placement/sequencing/technique. Hand over hand at all times to support RUE. TotalAx2 pivot in stedy to recliner. Lowered to recliner with Max of 2.    Balance Overall balance assessment: Needs assistance Sitting-balance support: No upper extremity supported;Feet supported Sitting balance-Leahy Scale: Fair Sitting balance - Comments: close guarding- able to maintain midline with min guard for short periods, then tends to lean posterior and right. Min-ModA at most when fatigued. RT UE needs guarding as often slides off bed. Postural control: Posterior lean;Right lateral lean   Standing balance-Leahy Scale: Poor Standing balance comment: reliant on external support and BUE support                           ADL either performed or assessed with clinical judgement   ADL Overall ADL's : Needs assistance/impaired Eating/Feeding: Minimal assistance;Bed level;Cueing for safety;Cueing for sequencing;Sitting Eating/Feeding Details (indicate cue type and reason): Pt observed eating with LT hand while in bed. Per daughter, pt no chewing with entire mouth, not just LT side. Once in recliner, pt attempting to lift RT UE to tray. Pt assisted with this and introduced lateral pinch to RT with hand over hand assist for pt to hold finger food. Pt cued to bring LT  hand in for support. Once pt grasped with LT, pt lowered RT UE which was then supported on pillow. Grooming: Cueing for safety;Cueing for sequencing;Bed  level;Wash/dry hands;Maximal assistance Grooming Details (indicate cue type and reason): Daughter initiating LT hand hygiene after eating with pt hold LT hand up to assist.         Upper Body Dressing : Sitting;Maximal assistance Upper Body Dressing Details (indicate cue type and reason): Cues for pt to attempt to thread RT UE through sleeve of gown. Pt able to raise hand off lap but not perform any active shoulder flexion for dressing. Possible decreased motor planning with need of max hand over hand assist to thread LUE through sleeve. Lower Body Dressing: Sitting/lateral leans;Total assistance;+2 for physical assistance Lower Body Dressing Details (indicate cue type and reason): Total assist to adjust socks at EOB with 2nd person for sitting balance/safety.   Toilet Transfer Details (indicate cue type and reason): Pt attempted sit to stand to RW with assist of 2 and RT UE support, however pt remains very stooped and unable to pivot.  RW removed and pt used Corene Cornea to t/f to recliner. Please see Mobility  section. Toileting- Clothing Manipulation and Hygiene: Total assistance Toileting - Clothing Manipulation Details (indicate cue type and reason): Pure wick             Vision   Additional Comments: Pt showing ability to locate food on tray and reach with accurate hand/eye targeting.   Perception     Praxis      Cognition Arousal/Alertness: Awake/alert Behavior During Therapy: Anxious Overall Cognitive Status: Impaired/Different from baseline Area of Impairment: Orientation;Attention;Memory;Following commands;Safety/judgement;Awareness;Problem solving                 Orientation Level: Disoriented to;Place;Time;Situation Current Attention Level: Sustained Memory: Decreased short-term memory;Decreased recall of precautions Following Commands: Follows one step commands inconsistently;Follows one step commands with increased time Safety/Judgement: Decreased awareness of  safety;Decreased awareness of deficits Awareness: Intellectual Problem Solving: Slow processing;Decreased initiation;Difficulty sequencing;Requires verbal cues;Requires tactile cues General Comments: hx of dementia; speech still a bit garbled but easier to understand today, did need prompting to answer some questions. Heavy VC and TC for sequencing. Very anxious and often cries out when attempting mobility. Diffiult to assess processing vs hearing vs motor planning deficits.        Exercises Other Exercises Other Exercises: Pt sat EOB several minutes with prompts and assist for RUE weight bearing on bed. Pt able to push through with elbow extension observed x 3 reps with verbal cues and RT hand support to keep on bed. Pt maintained each WB ~10 s econds then fatigued. About 1 min in b/t reps to recover. Other Exercises: Prompts to use RUE in all activities ad lib.   Shoulder Instructions       General Comments family present and supportive during session. HR to 124BPM max with activity, recovered well with rest    Pertinent Vitals/ Pain       Pain Assessment: No/denies pain Faces Pain Scale: No hurt Pain Location: Pr cried out with mobility, but each time, denied pain and confirmed fear. Pt frequently reassured of safety. Pain Intervention(s): Limited activity within patient's tolerance;Monitored during session  Home Living                                          Prior Functioning/Environment  Frequency  Min 2X/week        Progress Toward Goals  OT Goals(current goals can now be found in the care plan section)  Progress towards OT goals: Not progressing toward goals - comment  Acute Rehab OT Goals Patient Stated Goal: Per daughter-fot pt to go to CIR.  Daughter educated that pt with increased HR to 123 with light activity and may not yet be appropriate for CIR. OT Goal Formulation: With family Time For Goal Achievement: 03/02/21 Potential  to Achieve Goals: Good  Plan      Co-evaluation    PT/OT/SLP Co-Evaluation/Treatment: Yes Reason for Co-Treatment: Complexity of the patient's impairments (multi-system involvement)   OT goals addressed during session: ADL's and self-care;Strengthening/ROM      AM-PAC OT "6 Clicks" Daily Activity     Outcome Measure   Help from another person eating meals?: A Little Help from another person taking care of personal grooming?: A Lot Help from another person toileting, which includes using toliet, bedpan, or urinal?: A Lot Help from another person bathing (including washing, rinsing, drying)?: A Lot Help from another person to put on and taking off regular upper body clothing?: A Lot Help from another person to put on and taking off regular lower body clothing?: A Lot 6 Click Score: 13    End of Session Equipment Utilized During Treatment: Gait belt;Rolling walker  OT Visit Diagnosis: Unsteadiness on feet (R26.81);Other abnormalities of gait and mobility (R26.89);Muscle weakness (generalized) (M62.81);Feeding difficulties (R63.3)   Activity Tolerance Patient limited by fatigue   Patient Left in bed;with call bell/phone within reach;with bed alarm set;with family/visitor present   Nurse Communication Mobility status        Time: 6945-0388 OT Time Calculation (min): 38 min  Charges: OT General Charges $OT Visit: 1 Visit OT Treatments $Self Care/Home Management : 8-22 mins  Victorino Dike, OT Acute Rehab Services Office: [phone] 02/26/2021   Theodoro Clock 02/26/2021, 12:03 PM

## 2021-02-26 NOTE — Progress Notes (Signed)
Calorie Count Note  48 hour calorie count ordered.  Diet: regular Supplements: Carnation Instant Breakfast TID; Ensure Enlive po TID, each supplement provides 350 kcal and 20 grams of protein   Pt sleeping soundly at time of visit. No family at bedside. No further meal completions documented since yesterday. Per RN notes, pt consumed a few sips of Ensure last night.   Breakfast: 30 kcals, 1 gram protein Lunch: 326 kcals, 11 grams protein Dinner: 190 kcals, 0 grams protein   Total intake: 546 kcal (39% of minimum estimated needs)  12 grams protein (20% of minimum estimated needs)  Nutrition Dx: Inadequate oral intake related to decreased appetite as evidenced by per patient/family report; ongoing  Goal: Patient will meet greater than or equal to 90% of their needs; unmet  Intervention:   -Continue Ensure Enlive po TID, each supplement provides 350 kcal and 20 grams of protein  -Ensure Enlive po TID, each supplement provides 350 kcal and 20 grams of protein  -Continue Carnation Instant Breakfast TID with meals -Continue MVI with minerals daily -Continue liberalize diet of regular  Kristin Briggs, RD, LDN, CDCES Registered Dietitian II Certified Diabetes Care and Education Specialist Please refer to [facility] for RD and/or RD on-call/weekend/after hours pager

## 2021-02-26 NOTE — Progress Notes (Signed)
Family Medicine Teaching Service Daily Progress Note Intern Pager: [phone]  Patient name: Kristin Briggs Medical record number: [id number] Date of birth: November 06, 1919 Age: 87 y.o. Gender: female  Primary Care Provider: Pcp, No Consultants: Palliative, Neuro (s/o) Code Status: DNR  Pt Overview and Major Events to Date:  6/23: admitted, neuro consulted 6/24: neuro signed off, palliative consulted 6/27: new onset a-fib with RVR  Kristin Briggs is a 87 y.o. female who presented with dysarthria and was found to have acute ischemic CVA. PMH significant for colon abnormality and dementia.  Assessment and Plan:  A-Fib with RVR Resolved. Patient in NSR this morning with HR in the 80s. -Continue Digoxin per pharmacy -Tele monitoring  Acute Ischemic L MCA Stroke Patient with significantly improved movement/strength in right upper extremity today. Speech remains garbled, very difficult to understand. -Continue ASA and Lipitor daily -PT/OT -Plan for CIR re-eval today -Palliative following, appreciate recommendations  Dementia Mental status waxes and wanes. -Avoid QT prolonging agents for delirium (such as seroquel)   FEN/GI: Heart healthy diet PPx: Lovenox Dispo: CIR vs SNF  in 2-3 days.   Subjective:  No acute events overnight. Patient did well with HR in the 70-90s and normal BP. She denies complaints this morning although is only able to provide minimal history.  Objective: Temp:  [97.5 F (36.4 C)-98.2 F (36.8 C)] 98.2 F (36.8 C) (06/28 0400) Pulse Rate:  [69-122] 77 (06/28 0400) Resp:  [14-26] 16 (06/28 0400) BP: (78-141)/(52-104) 141/61 (06/28 0400) SpO2:  [92 %-99 %] 94 % (06/28 0400) Physical Exam: General: alert, resting comfortably in bed Cardiovascular: RRR, normal S1/S2 Respiratory: normal effort, lungs CTAB Abdomen: soft, nontender Extremities: no peripheral edema Neuro: oriented to person, speech nearly unintelligible, 5/5 strength on L, 3/5 strength in  right upper extremity  Laboratory: Recent Labs  Lab 02/21/21 1010 02/22/21 0228 02/23/21 0128  WBC 7.9 9.1 7.3  HGB 13.0 13.3 11.6*  HCT 41.7 41.9 36.9  PLT 197 204 184   Recent Labs  Lab 02/20/21 1315 02/20/21 1323 02/23/21 0128 02/24/21 0112 02/26/21 0206  NA 140   < > 141 142 143  K 4.0   < > 3.8 4.0 3.9  CL 107   < > 109 111 109  CO2 24   < > 22 22 25   BUN 14   < > 18 19 13   CREATININE 1.25*   < > 1.12* 0.98 1.00  CALCIUM 9.2   < > 8.6* 9.0 9.3  PROT 6.1*  --   --   --   --   BILITOT 0.9  --   --   --   --   ALKPHOS 62  --   --   --   --   ALT 10  --   --   --   --   AST 22  --   --   --   --   GLUCOSE 98   < > 65* 68* 80   < > = values in this interval not displayed.    Imaging/Diagnostic Tests: No results found.   , MD 02/26/2021, 6:09 AM PGY-1, [facility] Family Medicine FPTS Intern pager: [phone], text pages welcome

## 2021-02-26 NOTE — Progress Notes (Signed)
Physical Therapy Treatment Patient Details Name: Kristin Briggs MRN: [id number] DOB: 1919-12-08 Today's Date: 02/26/2021    History of Present Illness Pt is a 87 y.o. female admitted 02/20/21 as code stroke with dysarthria, LKW time on 6/21 at 2330. MRI showed acute infarct in posterior L frontal lobe; subacute L occipital infarct; punctate acute/subacute L frontoparietal and R frontal infarcts; several chronic infarcts, microvascular ischemic changes. PMH includes CKD, dementia.    PT Comments    Patient received in bed eating with family assisting/supervising. Doing better today, had improved communication however cue following remains very inconsistent; generally needed heavy verbal and tactile cues for all sequencing today. Very anxious and often cries out when attempting mobility. Still needs MaxAx2 for safe functional transfers, and ultimately ended up needing to use the stedy to pivot to recliner. HR to 124BPM with activity but recovered well with rest. Family continues to request CIR. Left up in recliner with all needs met, chair alarm active and family present. Will continue to follow.     Follow Up Recommendations  CIR;Supervision/Assistance - 24 hour     Equipment Recommendations  Other (comment) (TBD pending progress)    Recommendations for Other Services       Precautions / Restrictions Precautions Precautions: Fall Precaution Comments: decrease in motor control of R hand, inattention to R UE, L inattention, very anxious with mobility Restrictions Weight Bearing Restrictions: No    Mobility  Bed Mobility Overal bed mobility: Needs Assistance       Supine to sit: Mod assist;HOB elevated     General bed mobility comments: able to get to EOB with ModAx1, HOB elevated and increased time/cues ofr sequencing. Second person at distant min guard level for safety but not really needed    Transfers Overall transfer level: Needs assistance Equipment used: Ambulation  equipment used Transfers: Sit to/from [employer] Sit to Stand: Max assist;+2 physical assistance Stand pivot transfers: Total assist;+2 physical assistance       General transfer comment: attempted standing both with RW and stedy- needed MaxAx2 for both techniques, but much more safe in stedy. Very anxious. Heavy cues for hand placement/sequencing/technique. TotalAx2 pivot in stedy to recliner.  Ambulation/Gait             General Gait Details: unable   Stairs             Wheelchair Mobility    Modified Rankin (Stroke Patients Only)       Balance Overall balance assessment: Needs assistance Sitting-balance support: No upper extremity supported;Feet supported Sitting balance-Leahy Scale: Fair Sitting balance - Comments: close guarding- able to maintain midline with min guard for short periods, then tends to lean posterior and right. Min-ModA at most when fatigued. Postural control: Posterior lean;Right lateral lean   Standing balance-Leahy Scale: Poor Standing balance comment: reliant on external support and BUE support                            Cognition Arousal/Alertness: Awake/alert Behavior During Therapy: Anxious Overall Cognitive Status: Impaired/Different from baseline                     Current Attention Level: Sustained Memory: Decreased short-term memory;Decreased recall of precautions Following Commands: Follows one step commands inconsistently;Follows one step commands with increased time Safety/Judgement: Decreased awareness of safety;Decreased awareness of deficits Awareness: Intellectual Problem Solving: Slow processing;Decreased initiation;Difficulty sequencing;Requires verbal cues;Requires tactile cues General Comments: hx of dementia;  speech still a bit garbled but easier to understand today, did need prompting to answer some questions. Heavy VC and TC for sequencing. Very anxious and often cries out when  attempting mobility      Exercises      General Comments General comments (skin integrity, edema, etc.): family present and supportive during session. HR to 124BPM max with activity, recovered well with rest      Pertinent Vitals/Pain Pain Assessment: Faces Faces Pain Scale: No hurt Pain Intervention(s): Limited activity within patient's tolerance;Monitored during session    Home Living                      Prior Function            PT Goals (current goals can now be found in the care plan section) Acute Rehab PT Goals Patient Stated Goal: none stated PT Goal Formulation: With patient Time For Goal Achievement: 03/07/21 Potential to Achieve Goals: Fair Progress towards PT goals: Progressing toward goals    Frequency    Min 4X/week      PT Plan Current plan remains appropriate    Co-evaluation              AM-PAC PT "6 Clicks" Mobility   Outcome Measure  Help needed turning from your back to your side while in a flat bed without using bedrails?: A Lot Help needed moving from lying on your back to sitting on the side of a flat bed without using bedrails?: A Lot Help needed moving to and from a bed to a chair (including a wheelchair)?: Total Help needed standing up from a chair using your arms (e.g., wheelchair or bedside chair)?: Total Help needed to walk in hospital room?: Total Help needed climbing 3-5 steps with a railing? : Total 6 Click Score: 8    End of Session Equipment Utilized During Treatment: Gait belt Activity Tolerance: Patient tolerated treatment well Patient left: in chair;with call bell/phone within reach;with chair alarm set Nurse Communication: Mobility status PT Visit Diagnosis: Other abnormalities of gait and mobility (R26.89);Other symptoms and signs involving the nervous system (R29.898);Muscle weakness (generalized) (M62.81)     Time: 0233-4356 PT Time Calculation (min) (ACUTE ONLY): 38 min  Charges:  $Therapeutic  Activity: 8-22 mins co treat with OT  $Neuromuscular Re-education: 8-22 mins                    Windell Norfolk, DPT, PN1   Supplemental Physical Therapist [place]    Pager [phone] Acute Rehab Office [phone]

## 2021-02-26 NOTE — Progress Notes (Signed)
   Palliative Medicine Inpatient Follow Up Note   HPI: Per intake H&P --> Ms. TWANISHA FOULK is a 87 y.o. female with history of dementia, heart failure (EF 20%), colon abrnormality who present with aphasia, RUE incoordination and weakness and mild dysarthria.  Allyn suffered an acute ischemic stroke.   Palliative care was asked to discuss goals of care in the setting of progressive dementia and concern for recurrent aspirational events.  Today's Discussion (* ): I met with the patient and her daughter Tomi Bamberger at the bedside.  Her daughter states she is happy because the patient appears much stronger, no longer talking only out of 1 side.  They are hopeful for inpatient rehab admission, although evaluation has been delayed for another 2 to 3 days because of her difficulty in participation yesterday (although she was doing quite well the day before that).  We discussed continued plan for skilled nursing facility rehab as a backup to inpatient rehab.  Today the patient denies pain, nausea, vomiting.  She has not had a bowel movement since Sunday (2 days ago) but they states she has not been eating as much over the past 2 days.  Denies any abdominal bloating or fullness.  *Please note that this is a verbal dictation therefore any spelling or grammatical errors are due to the "[employer]" system interpretation.  Discussed the importance of continued conversation with family and their  medical providers regarding overall plan of care and treatment options, ensuring decisions are within the context of the patients values and GOCs.  Questions and concerns addressed   Objective Assessment: Vital Signs Vitals:   02/26/21 0400 02/26/21 0826  BP: (!) 141/61 (!) 158/77  Pulse: 77 89  Resp: 16 15  Temp: 98.2 F (36.8 C) 98.4 F (36.9 C)  SpO2: 94% 94%    Intake/Output Summary (Last 24 hours) at 02/26/2021 1127 Last data filed at 02/26/2021 0400 Gross per 24 hour  Intake 50 ml  Output  900 ml  Net -850 ml   Last Weight  Most recent update: 02/21/2021  5:05 AM    Weight  56 kg (123 lb 7.3 oz)            Gen:  NAD HEENT: moist mucous membranes CV: Regular rate and rhythm, no murmurs rubs or gallops PULM: clear to auscultation bilaterally. No wheezes/rales/rhonchi ABD: soft/nontender/nondistended/normal bowel sounds EXT: No edema Neuro: Alert and oriented, speech altered but understandable.   SUMMARY OF RECOMMENDATIONS   Remains DNR/DNI  MOST on the chart  Continued evaluation for CIR placement, SNF/Rehab as backup  Ongoing PMT support and follow-up  Time In: 9:00 Time Out: 9:35   Time Spent: 35 min Greater than 50% of the time was spent in counseling and coordination of care ______________________________________________________________________________________ Walden Field, NP [place] Team Team Cell Phone: [phone]  Wadie Lessen NP was in discussion and collaboration with Walden Field NP  and agrees with above assessment and plan   Please utilize secure chat with additional questions, if there is no response within 30 minutes please call the above phone number  Palliative Medicine Team providers are available by phone from 7am to 7pm daily and can be reached through the team cell phone.  Should this patient require assistance outside of these hours, please call the patient's attending physician.

## 2021-02-26 NOTE — Progress Notes (Signed)
Inpatient Rehab Admissions Coordinator:   Reviewed case with Dr. Riley Kill following therapy sessions today.  Note that she has had fluctuations in the amount of assist needed for mobility (ranging from mod +2 to total +2) and has not demonstrated consistent improvement.  Per documentation, she has been very anxious with mobility and HR up to 120s even with limited mobility.  Also note in PT/OT evaluations that, prior to admission, pt was requiring supervision for ambulation with a RW, and assist for ADLs.  Given her multiple infarcts, and inconsistent progress with therapy, it is unlikely that she would be able to make large gains in a short period of time, and with elevated HR/anxiety she likely would not be able to tolerate the intensity of this program.  She is not a candidate for CIR, so I will sign off.   Estill Dooms, PT, DPT Admissions Coordinator [phone] 02/26/21  2:02 PM

## 2021-02-27 LAB — SARS CORONAVIRUS 2 (TAT 6-24 HRS): SARS Coronavirus 2: NEGATIVE

## 2021-02-27 LAB — GLUCOSE, CAPILLARY: Glucose-Capillary: 131 mg/dL — ABNORMAL HIGH (ref 70–99)

## 2021-02-27 MED ORDER — METOPROLOL SUCCINATE ER 25 MG PO TB24
12.5000 mg | ORAL_TABLET | Freq: Every day | ORAL | Status: DC
  Administered 2021-02-27 – 2021-02-28 (×2): 12.5 mg via ORAL
  Filled 2021-02-27 (×2): qty 1

## 2021-02-27 NOTE — TOC Progression Note (Signed)
Transition of Care [facility]) - Progression Note    Patient Details  Name: LILYONA RICHNER MRN: [id number] Date of Birth: 07-14-1920  Transition of Care [facility]) CM/SW Contact  Mearl Latin, LCSW Phone Number: 02/27/2021, 12:05 PM  Clinical Narrative:    Patient's daughter has selected [facility]. Per [facility], they should have a bed available tomorrow. No other SNF beds at this time.    Expected Discharge Plan: Skilled Nursing Facility Barriers to Discharge: SNF Pending bed offer  Expected Discharge Plan and Services Expected Discharge Plan: Skilled Nursing Facility In-house Referral: Clinical Social Work   Post Acute Care Choice: Skilled Nursing Facility Living arrangements for the past 2 months: Single Family Home                                       Social Determinants of Health (SDOH) Interventions    Readmission Risk Interventions No flowsheet data found.

## 2021-02-27 NOTE — Progress Notes (Signed)
   Palliative Medicine Inpatient Follow Up Note   HPI: Per intake H&P --> [name] is a 87 y.o. female with history of dementia, heart failure (EF 20%), colon abrnormality who present with aphasia, RUE incoordination and weakness and mild dysarthria.  [place] suffered an acute ischemic stroke.   Palliative care was asked to discuss goals of care in the setting of progressive dementia and concern for recurrent aspirational events.  Today's Discussion (02/27/2021):    I reviewed the chart.   I met with Kristin Briggs at bedside this morning.  Kristin Briggs was in no acute distress and was verbally responsive.  She denied pain or difficulty breathing.  I reviewed with her the plan for discharge to skilled nursing.  I was able to talk to her nursing technician as well who had no concerns at that time.  A copy of advance directives have been obtained.  Chart reviewed plan for transition to [facility] skilled nursing tomorrow.  Objective Assessment: Vital Signs Vitals:   02/27/21 0400 02/27/21 0849  BP: (!) 141/75 139/81  Pulse: 94 93  Resp: 20 18  Temp: 97.7 F (36.5 C) 97.7 F (36.5 C)  SpO2: 94% 93%    Intake/Output Summary (Last 24 hours) at 02/27/2021 1207 Last data filed at 02/27/2021 0400 Gross per 24 hour  Intake 120 ml  Output 450 ml  Net -330 ml    Last Weight  Most recent update: 02/21/2021  5:05 AM    Weight  56 kg (123 lb 7.3 oz)            Gen:  NAD HEENT: moist mucous membranes CV: Regular rate and rhythm  PULM: On RA ABD: soft/nontender  EXT: No edema Neuro: Alert and oriented, speech altered but understandable   SUMMARY OF RECOMMENDATIONS   Remains DNR/DNI  Gold DNR/MOST on the chart and to scanned into Vynca  Advanced directives obtained and will be scanned into bank appropriately  Plan for transition to [facility] tomorrow  Ongoing PMT support and follow-up  Time Spent: 25 min Greater than 50% of the time was spent in counseling and  coordination of care ______________________________________________________________________________________ Tacey Ruiz, NP [place] Team Team Cell Phone: [phone]  Please utilize secure chat with additional questions, if there is no response within 30 minutes please call the above phone number  Palliative Medicine Team providers are available by phone from 7am to 7pm daily and can be reached through the team cell phone.  Should this patient require assistance outside of these hours, please call the patient's attending physician.

## 2021-02-27 NOTE — Progress Notes (Signed)
Family Medicine Teaching Service Daily Progress Note Intern Pager: [phone]  Patient name: Kristin Briggs Medical record number: [id number] Date of birth: [birth year] Age: 87 y.o. Gender: female  Primary Care Provider: Pcp, No Consultants: Palliative, neuro (s/o) Code Status: DNR  Pt Overview and Major Events to Date:  6/23: admitted, neuro consulted 6/24: neuro signed off, palliative consulted 6/27: new onset a-fib with RVR 6/28: medically stable for discharge to SNF  Kristin Briggs is a 87 y.o. female who presented with dysarthria and was found to have acute ischemic CVA. PMH significant for colon abnormality and dementia.  Assessment and Plan:  Acute Ischemic L MCA Stroke Patient's speech much improved this morning-- fully intelligible. Continues to have 3/5 strength in right upper extremity. Unfortunately not a candidate for CIR. -Continue ASA 81mg  daily -Continue Atorvastatin 40mg  daily -PT/OT -Appreciate TOC assistance with SNF placement  A-Fib with RVR Resolved. Patient in NSR with HR 80-90s over past 24h. -Continue digoxin per pharmacy  HTN BP elevated to 140s and 150s systolic. -Continue to monitor  FEN/GI: Regular diet PPx: Lovenox Dispo:SNF in 2-3 days. Barriers include awaiting SNF.   Subjective:  No acute events overnight. Patient reports she's tired but otherwise feels ok this morning.  Objective: Temp:  [97.7 F (36.5 C)-98.6 F (37 C)] 97.7 F (36.5 C) (06/29 0400) Pulse Rate:  [85-97] 94 (06/29 0400) Resp:  [15-20] 20 (06/29 0400) BP: (125-158)/(69-82) 141/75 (06/29 0400) SpO2:  [92 %-94 %] 94 % (06/29 0400) Physical Exam: General: alert, resting comfortably, NAD Cardiovascular: RRR, normal S1/S2 without m/r/g Respiratory: normal effort Abdomen: soft, nontender Extremities: no peripheral edema Neuro: speech minimally garbled, easily intelligible, 3/5 strength on R, 5/5 strength on L  Laboratory: Recent Labs  Lab 02/21/21 1010  02/22/21 0228 02/23/21 0128  WBC 7.9 9.1 7.3  HGB 13.0 13.3 11.6*  HCT 41.7 41.9 36.9  PLT 197 204 184   Recent Labs  Lab 02/20/21 1315 02/20/21 1323 02/23/21 0128 02/24/21 0112 02/26/21 0206  NA 140   < > 141 142 143  K 4.0   < > 3.8 4.0 3.9  CL 107   < > 109 111 109  CO2 24   < > 22 22 25   BUN 14   < > 18 19 13   CREATININE 1.25*   < > 1.12* 0.98 1.00  CALCIUM 9.2   < > 8.6* 9.0 9.3  PROT 6.1*  --   --   --   --   BILITOT 0.9  --   --   --   --   ALKPHOS 62  --   --   --   --   ALT 10  --   --   --   --   AST 22  --   --   --   --   GLUCOSE 98   < > 65* 68* 80   < > = values in this interval not displayed.     Imaging/Diagnostic Tests: No results found.   02/26/21, MD 02/27/2021, 7:20 AM PGY-1, [facility] Family Medicine FPTS Intern pager: [phone], text pages welcome

## 2021-02-27 NOTE — Progress Notes (Signed)
Physical Therapy Treatment Patient Details Name: Kristin Briggs MRN: [id number] DOB: August 08, 1920 Today's Date: 02/27/2021    History of Present Illness Pt is a 87 y.o. female admitted 02/20/21 as code stroke with dysarthria, LKW time on 6/21 at 2330. MRI showed acute infarct in posterior L frontal lobe; subacute L occipital infarct; punctate acute/subacute L frontoparietal and R frontal infarcts; several chronic infarcts, microvascular ischemic changes. PMH includes CKD, dementia.    PT Comments    Patient received in bed, family present and very supportive. Much more anxious today and continues to yell out as well as physically resisting movement. Cognition definitely more impaired today than in prior sessions. Able to get to EOB and attempt transfers in the steady, however patient very anxious and not initiating movement at all. HR to 137BPM today. Returned to bed and left positioned to comfort in chair position with bed alarm active and HR recovering as expected with rest.  Would really do best in SNF setting.    Follow Up Recommendations  SNF;Supervision/Assistance - 24 hour     Equipment Recommendations  Other (comment) (TBD pending progress)    Recommendations for Other Services       Precautions / Restrictions Precautions Precautions: Fall Precaution Comments: decrease in motor control of R hand, inattention to R UE, L inattention, very anxious with mobility Restrictions Weight Bearing Restrictions: No    Mobility  Bed Mobility Overal bed mobility: Needs Assistance Bed Mobility: Sit to Supine;Rolling;Sidelying to Sit Rolling: Mod assist Sidelying to sit: Mod assist;+2 for physical assistance;HOB elevated   Sit to supine: Max assist;+2 for physical assistance   General bed mobility comments: more physically resistant and with greater difficulty sequencing today even with heavy hand over hand assist    Transfers Overall transfer level: Needs assistance Equipment used:  Ambulation equipment used Transfers: Sit to/from Stand Sit to Stand: Max assist;+2 physical assistance         General transfer comment: heavy maxAx2 to stand from bed into stedy with no physical initiation noted from patient today; also required heavy maxAx2 to initiate standing from elevated stedy seat. HR to 137BPM with sit to stand activity so deferred further transfers/activity  Ambulation/Gait             General Gait Details: unable   Stairs             Wheelchair Mobility    Modified Rankin (Stroke Patients Only)       Balance Overall balance assessment: Needs assistance Sitting-balance support: No upper extremity supported;Feet supported Sitting balance-Leahy Scale: Fair Sitting balance - Comments: MinA for midline sitting, unpredictable balance loss     Standing balance-Leahy Scale: Zero Standing balance comment: reliant on external support                            Cognition Arousal/Alertness: Awake/alert Behavior During Therapy: Anxious Overall Cognitive Status: Impaired/Different from baseline Area of Impairment: Orientation;Attention;Memory;Following commands;Safety/judgement;Awareness;Problem solving                 Orientation Level: Disoriented to;Place;Time;Situation Current Attention Level: Sustained Memory: Decreased short-term memory;Decreased recall of precautions Following Commands: Follows one step commands inconsistently;Follows one step commands with increased time Safety/Judgement: Decreased awareness of safety;Decreased awareness of deficits Awareness: Intellectual Problem Solving: Slow processing;Decreased initiation;Difficulty sequencing;Requires verbal cues;Requires tactile cues General Comments: hx of dementia, cognition a bit worse than typical this afternoon. Forgets what we are doing and what we are there for  after being told just a few minutes ago. Much more physically resistant and yelling out today.       Exercises      General Comments General comments (skin integrity, edema, etc.): HR to 137BPM max, unsure if this was anxiety driven or true dysrhythmia      Pertinent Vitals/Pain Pain Assessment: Faces Faces Pain Scale: No hurt Pain Location: Pr cried out with mobility, but each time, denied pain and confirmed fear. Pt frequently reassured of safety. Pain Intervention(s): Limited activity within patient's tolerance;Monitored during session    Home Living                      Prior Function            PT Goals (current goals can now be found in the care plan section) Acute Rehab PT Goals Patient Stated Goal: go to snf PT Goal Formulation: With patient Time For Goal Achievement: 03/07/21 Potential to Achieve Goals: Fair Progress towards PT goals: Progressing toward goals    Frequency    Min 3X/week      PT Plan Discharge plan needs to be updated    Co-evaluation              AM-PAC PT "6 Clicks" Mobility   Outcome Measure  Help needed turning from your back to your side while in a flat bed without using bedrails?: A Lot Help needed moving from lying on your back to sitting on the side of a flat bed without using bedrails?: Total Help needed moving to and from a bed to a chair (including a wheelchair)?: Total Help needed standing up from a chair using your arms (e.g., wheelchair or bedside chair)?: Total Help needed to walk in hospital room?: Total Help needed climbing 3-5 steps with a railing? : Total 6 Click Score: 7    End of Session   Activity Tolerance: Patient tolerated treatment well Patient left: in bed;with call bell/phone within reach;with bed alarm set (chair position) Nurse Communication: Mobility status PT Visit Diagnosis: Other abnormalities of gait and mobility (R26.89);Other symptoms and signs involving the nervous system (R29.898);Muscle weakness (generalized) (M62.81)     Time: 5400-8676 PT Time Calculation (min) (ACUTE  ONLY): 21 min  Charges:  $Therapeutic Activity: 8-22 mins                    Madelaine Etienne, DPT, PN1   Supplemental Physical Therapist [facility]    Pager [phone] Acute Rehab Office [phone]

## 2021-02-27 NOTE — Care Management Important Message (Signed)
Important Message  Patient Details  Name: Kristin Briggs MRN: [id number] Date of Birth: 17-May-1920   Medicare Important Message Given:  Yes     Dorena Bodo 02/27/2021, 3:12 PM

## 2021-02-28 DIAGNOSIS — I5020 Unspecified systolic (congestive) heart failure: Secondary | ICD-10-CM

## 2021-02-28 LAB — DIGOXIN LEVEL: Digoxin Level: 0.8 ng/mL (ref 0.8–2.0)

## 2021-02-28 MED ORDER — METOPROLOL SUCCINATE ER 25 MG PO TB24: 12.5000 mg | ORAL_TABLET | Freq: Every day | ORAL | Status: AC

## 2021-02-28 MED ORDER — APIXABAN 2.5 MG PO TABS: 2.5000 mg | ORAL_TABLET | Freq: Two times a day (BID) | ORAL | Status: AC

## 2021-02-28 MED ORDER — DIGOXIN 62.5 MCG PO TABS
0.0625 mg | ORAL_TABLET | ORAL | Status: AC
Start: 2021-03-01 — End: ?

## 2021-02-28 MED ORDER — ATORVASTATIN CALCIUM 40 MG PO TABS: 40.0000 mg | ORAL_TABLET | Freq: Every day | ORAL | Status: AC

## 2021-02-28 NOTE — Progress Notes (Signed)
Patient discharged to SNF via [facility]. [name], patient's daughter made aware.

## 2021-02-28 NOTE — Progress Notes (Addendum)
Attempted to call report to SNF. They took my name and number and said they will call me back.   Selena Batten, RN called me back and I gave her report.

## 2021-02-28 NOTE — TOC Transition Note (Signed)
Transition of Care [facility]) - CM/SW Discharge Note   Patient Details  Name: Kristin Briggs MRN: [id number] Date of Birth: 1920/02/02  Transition of Care [facility]) CM/SW Contact:  Mearl Latin, LCSW Phone Number: 02/28/2021, 1:06 PM   Clinical Narrative:    Patient will DC to: [facility] Anticipated DC date: 02/28/21 Family notified: Daughter, [name] Transport by: Sharin Mons   Per MD patient ready for DC to [facility]. RN to call report prior to discharge (Report [phone] Room 3213). RN, patient, patient's family, and facility notified of DC. Discharge Summary and FL2 sent to facility. DC packet on chart. Ambulance transport requested for patient.   CSW will sign off for now as social work intervention is no longer needed. Please consult [place] again if new needs arise.     Final next level of care: Skilled Nursing Facility Barriers to Discharge: Barriers Resolved   Patient Goals and CMS Choice Patient states their goals for this hospitalization and ongoing recovery are:: Rehab CMS Medicare.gov Compare Post Acute Care list provided to:: Patient Represenative (must comment) Choice offered to / list presented to : Adult Children (and granddaughter)  Discharge Placement   Existing PASRR number confirmed : 02/28/21          Patient chooses bed at: [facility] Patient to be transferred to facility by: [facility] Name of family member notified: [name], daughter Patient and family notified of of transfer: 02/28/21  Discharge Plan and Services In-house Referral: Clinical Social Work   Post Acute Care Choice: Skilled Nursing Facility                               Social Determinants of Health (SDOH) Interventions     Readmission Risk Interventions No flowsheet data found.

## 2021-02-28 NOTE — Progress Notes (Addendum)
Family Medicine Teaching Service Daily Progress Note Intern Pager: [phone]  Patient name: Kristin Briggs Medical record number: [id number] Date of birth: 01/27/1920 Age: 87 y.o. Gender: female  Primary Care Provider: Pcp, No Consultants: Neuro (s/o) Code Status: DNR  Pt Overview and Major Events to Date:  6/22: admitted as code stroke, neuro consulted 6/24: palliative consulted, neuro signed off 6/27: new onset a-fib with RVR 6/28: medically stable for discharge to SNF  Kristin Briggs is a 87 y.o. female who presented with dysarthria and was found to have acute CVA. PMH significant for dementia and colon abnormality (?diverticulosis).  Assessment and Plan:  Acute L MCA Stroke Mental status and neuro exam waxes and wanes. Continues to have improved use of R arm as compared to admission. Speech more garbled than yesterday. -Continue ASA 81mg  daily -Atorvastatin 40mg  daily -PT/OT -Awaiting SNF placement, anticipate d/c today  A-Fib with RVR Resolved, patient in NSR with HR 90s. -Continue digoxin -Metoprolol 12.5mg  -Consider anti-coagulation. Risks/benefits discussed with family and they are thinking about it.  HTN Patient with some elevated BPs charted. Manual BP this morning was wnl at 127/65 -Continue Metoprolol 12.5mg  daily  HFrEF Echo on admission with EF<20%. Family did not wish to pursue aggressive workup/treatments.   FEN/GI: Regular diet PPx: Lovenox Dispo:SNF today. Barriers include awaiting SNF bed offer.   Subjective:  No acute events overnight.   Objective: Temp:  [97.7 F (36.5 C)-98.9 F (37.2 C)] 98.1 F (36.7 C) (06/30 0345) Pulse Rate:  [92-108] 93 (06/30 0345) Resp:  [17-21] 17 (06/30 0345) BP: (126-155)/(55-91) 155/74 (06/30 0345) SpO2:  [92 %-95 %] 95 % (06/30 0345) Physical Exam: General: alert, resting comfortably in bed, NAD Cardiovascular: RRR, normal S1/S2, II/VI systolic murmur Respiratory: normal effort Abdomen: soft,  nontender Extremities: no peripheral edema Neuro: alert, oriented to self and place, 3/5 strength in R upper extremity, 5/5 strength on left. Speech difficult to understand but has moments of clarity.  Laboratory: Recent Labs  Lab 02/21/21 1010 02/22/21 0228 02/23/21 0128  WBC 7.9 9.1 7.3  HGB 13.0 13.3 11.6*  HCT 41.7 41.9 36.9  PLT 197 204 184   Recent Labs  Lab 02/23/21 0128 02/24/21 0112 02/26/21 0206  NA 141 142 143  K 3.8 4.0 3.9  CL 109 111 109  CO2 22 22 25   BUN 18 19 13   CREATININE 1.12* 0.98 1.00  CALCIUM 8.6* 9.0 9.3  GLUCOSE 65* 68* 80     Imaging/Diagnostic Tests: No results found.   02/26/21, MD 02/28/2021, 7:43 AM PGY-1, [facility] Family Medicine FPTS Intern pager: [phone], text pages welcome

## 2021-02-28 NOTE — Progress Notes (Signed)
This chaplain was present with the Pt. and RN-Samantha to celebrate the Pt. birthday with a card. The Pt. was very appreciative of the spiritual care and is anticipating meeting her daughter [name] at [place] today.

## 2021-02-28 NOTE — Discharge Summary (Signed)
Family Medicine Teaching [facility] Discharge Summary  Patient name: Kristin Briggs Medical record number: [id number] Date of birth: 08/25/1920 Age: 87 y.o. Gender: female Date of Admission: 02/20/2021  Date of Discharge: 02/28/2021 Admitting Physician: Jovita Kussmaul, MD  Primary Care Provider: Pcp, No Consultants: Neuro, palliative  Indication for Hospitalization: Acute CVA  Discharge Diagnoses/Problem List:  Acute L MCA Stroke A-Fib with RVR HFrEF HTN Dementia  Disposition: SNF  Discharge Condition: Stable  Discharge Exam:  Gen: frail elderly female sitting comfortably in chair, NAD CV: RRR, normal S1/S2, II/VI systolic murmur Resp: normal effort GI: soft, nontender Ext: no peripheral edema Neuro: oriented to self and place, speech sometimes garbled and other times easily intelligible, 3/5 strength in right upper and lower extremities, 5/5 strength on L, able to follow commands appropriately  Brief Hospital Course:  Kristin Briggs is a 87 year old female who presented with dysarthria and was found to have acute ischemic stroke.  PMH prior to admission was significant for colon abnormality and dementia.  Left MCA Ischemic Stroke Patient presented with dysarthria and decreased coordination of her right arm/hand. MRI brain revealed acute infarct in posterior left frontal lobe involving corona radiata and extending to the cortex; more subacute appearing left occipital infarct; and few additional punctate acute or subacute left frontoparietal and right frontal infarcts. Neurology was consulted and recommended high intensity statin (Lipitor 40mg ) and ASA 81mg  daily. ASA was discontinued at the time of discharge, as patient was started on Eliquis for her a-fib. PT/OT worked with the patient throughout admission. She was not a candidate for CIR, but did make some progress with her R arm strength and speech during hospitalization.  HFrEF Echocardiogram was obtained during  admission and showed global hypokinesis with akinesis of the posterior lateral and basal to mid inferior myocardium.  EF <20%, moderately dilated left atrium, mild aortic valve stenosis and calcification.  Cardiology consult was discussed but ultimately not done as patient's daughter did not want aggressive management and expressed a preference for supportive care.  New onset Paroxysmal A-Fib with RVR Patient went into a-fib with RVR during admission. She was treated IV Diltiazem initially due to concurrent hypotension. Patient was then started on digoxin with improvement. She converted to normal sinus and remained in normal sinus at the time of discharge. She was also started on Metoprolol 12.5mg  daily once her blood pressure improved and started on Eliquis 2.5mg  BID at the time of discharge. Discussed risks/benefits of anticoagulation in detail with patient's daughter.  Goals of Care Palliative care followed the patient throughout admission. She remained DNR. Family expressed an overall preference for treatment that would preserve function, but in general wanted to avoid aggressive interventions.  Issues for Follow Up:  Patient started on Eliquis 2.5mg  BID at time of discharge Patient also started on Digoxin, metoprolol, and atorvastatin during admission Recommend checking CBC, BMP, and digoxin level in 1 week Ongoing goals of care discussions  Significant Procedures: None  Significant Labs and Imaging:  Recent Labs  Lab 02/22/21 0228 02/23/21 0128  WBC 9.1 7.3  HGB 13.3 11.6*  HCT 41.9 36.9  PLT 204 184   Recent Labs  Lab 02/22/21 0228 02/23/21 0128 02/24/21 0112 02/26/21 0206  NA 140 141 142 143  K 3.9 3.8 4.0 3.9  CL 103 109 111 109  CO2 20* 22 22 25   GLUCOSE 113* 65* 68* 80  BUN 17 18 19 13   CREATININE 1.26* 1.12* 0.98 1.00  CALCIUM 9.7 8.6* 9.0 9.3  MG  --   --   --  1.8   MR ANGIO HEAD WO CONTRAST MR ANGIO NECK W WO CONTRAST Result Date: 02/20/2021 IMPRESSION: MRA  HEAD IMPRESSION: 1. Negative intracranial MRA for large vessel occlusion. 2. Diffuse intracranial atherosclerotic disease, overall mild for age. No hemodynamically significant or correctable stenosis. 3. 3 mm focal outpouching arising from the supraclinoid right ICA. While this is favored to reflect a vascular infundibulum, a small aneurysm is difficult to exclude. Attention at follow-up recommended. MRA NECK IMPRESSION: 1. 60% atheromatous stenosis at the left carotid bulb/proximal left ICA. 2. No hemodynamically significant stenosis about the right carotid artery system. 3. Wide patency of the vertebral arteries within the neck. 4. Diffuse tortuosity the major arterial vasculature of the neck, suggesting chronic underlying hypertension. Electronically Signed   By: Rise Mu M.D.   On: 02/20/2021 19:37   MR BRAIN WO CONTRAST Result Date: 02/20/2021 IMPRESSION: Acute infarct of the posterior left frontal lobe involving corona radiata extending to the cortex. Some involvement of lateral precentral gyrus. More subacute appearing left occipital infarct. Few additional punctate acute or subacute left frontoparietal and right frontal infarcts. Several chronic infarcts and moderate chronic microvascular ischemic changes. Initial results were reviewed by telephone at the time of interpretation on 02/20/2021 at 1:51 pm with provider [facility] , who verbally acknowledged these results. Electronically Signed   By: Guadlupe Spanish M.D.   On: 02/20/2021 14:00   ECHOCARDIOGRAM COMPLETE Result Date: 02/20/2021 IMPRESSIONS  1. Global hypkinesis with akinesis of the posterolateral and basal to mid inferior myocardium. Left ventricular ejection fraction, by estimation, is <20%. The left ventricle has severely decreased function. The left ventricle demonstrates global hypokinesis. There is mild left ventricular hypertrophy of the basal-septal segment. Left ventricular diastolic parameters are indeterminate.  2.  Right ventricular systolic function is normal. The right ventricular size is normal. There is normal pulmonary artery systolic pressure.  3. Left atrial size was moderately dilated.  4. The mitral valve is degenerative. Mild mitral valve regurgitation. No evidence of mitral stenosis.  5. The aortic valve is calcified. There is mild calcification of the aortic valve. There is mild thickening of the aortic valve. Aortic valve regurgitation is not visualized. Mild aortic valve stenosis. Aortic valve area, by VTI measures 0.77 cm. Aortic valve mean gradient measures 11.2 mmHg. Aortic valve Vmax measures 2.18 m/s.  6. The inferior vena cava is normal in size with greater than 50% respiratory variability, suggesting right atrial pressure of 3 mmHg.   CT HEAD CODE STROKE WO CONTRAST Result Date: 02/20/2021 IMPRESSION: There is no acute intracranial hemorrhage or evidence of acute infarction. ASPECT score is 10. 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 Moderate chronic microvascular ischemic changes. Electronically Signed   By: Guadlupe Spanish M.D.   On: 02/20/2021 13:34   VAS US CAROTID Result Date: 02/25/2021 Summary: Right Carotid: Velocities in the right ICA are consistent with a 1-39% stenosis. Non-hemodynamically significant plaque <50% noted in the CCA. The ECA appears <50% stenosed. Left Carotid: Velocities in the left ICA are consistent with a 1-39% stenosis.           Non-hemodynamically significant plaque <50% noted in the CCA. The ECA appears <50% stenosed. Vertebrals:  Bilateral vertebral arteries demonstrate antegrade flow. Subclavians: Normal flow hemodynamics were seen in bilateral subclavian arteries. *See table(s) above for measurements and observations.  Electronically signed by Delia Heady MD on 02/25/2021 at 5:36:21 PM.    Final      Results/Tests Pending at Time of Discharge: None  Discharge Medications:  Allergies as of 02/28/2021   No Known Allergies      Medication  List     TAKE these medications    apixaban 2.5 MG Tabs tablet Commonly known as: ELIQUIS Take 1 tablet (2.5 mg total) by mouth 2 (two) times daily.   atorvastatin 40 MG tablet Commonly known as: LIPITOR Take 1 tablet (40 mg total) by mouth daily. Start taking on: March 01, 2021   Digoxin 62.5 MCG Tabs Take 0.0625 mg by mouth every other day. Start taking on: March 01, 2021   metoprolol succinate 25 MG 24 hr tablet Commonly known as: TOPROL-XL Take 0.5 tablets (12.5 mg total) by mouth daily. Start taking on: March 01, 2021        Discharge Instructions: Please refer to Patient Instructions section of EMR for full details.  Patient was counseled important signs and symptoms that should prompt return to medical care, changes in medications, dietary instructions, activity restrictions, and follow up appointments.   Follow-Up Appointments:  Future Appointments  Date Time Provider Department Center  04/01/2021  1:30 PM Sharon Seller, NP [facility] None  Additional appointments per SNF   Maury Dus, MD 02/28/2021, 11:56 AM PGY-1, [facility] Family Medicine

## 2021-02-28 NOTE — Progress Notes (Addendum)
  Speech Language Pathology Treatment: Cognitive-Linguistic (Aphasia, Dysarthria) ;Dysphagia Patient Details Name: Kristin Briggs MRN: [id number] DOB: 27-Jan-1920 Today's Date: 02/28/2021 Time: 6389-3734 SLP Time Calculation (min) (ACUTE ONLY): 24 min  Assessment / Plan / Recommendation Clinical Impression  Pt was seen for treatment. She was alert and cooperative during the session. Pt's breakfast tray was present, but she consumed limited solid boluses and reported that she was not hungry. Mastication was prolonged and mild oral residue was noted, but cleared with a liquid wash. Pt demonstrated throat clearing once with a bolus of thin liquids, but no other s/sx of aspiration were noted. Pt was educated regarding the nature of dysarthria, and compensatory strategies to improve speech intelligibility. Pt verbalized understanding regarding all areas of education, but reinforcement will likely be needed. She used compensatory strategies at the word level with 70% accuracy increasing to 100% accuracy with cues for overarticulation and vocal intensity. Pt demonstrated 60% accuracy with confrontational naming increasing to 100% with part-word and semantic cues. She required moderate support for verbal expression at the phrase level during structured tasks, but produced some spontaneous utterances such as, "I don't think so." SLP will continue to follow pt.    HPI HPI: Pt is a 87 yo female adm to [facility] with right facial droop, aphasia, right sided weakness.  Per imaging, pt with "posterior left frontal lobe involving corona  radiata extending to the cortex. Some involvement of lateral  precentral gyrus.     More subacute appearing left occipital infarct. Few additional punctate acute or subacute left frontoparietal and right frontal infarcts. Several chronic infarcts and moderate chronic microvascular ischemic changes." Pt also has h/o cogntitive deficit diagnosed in 2005 but not worked up further per daughter.   Pt resides with her daughter and granddaughter and was fluent prior to admission. She reports she enjoys sitting on the porch and watching cars and people.      SLP Plan  Continue with current plan of care       Recommendations  Diet recommendations: Regular;Thin liquid Liquids provided via: Cup;Straw Medication Administration: Whole meds with puree Supervision: Staff to assist with self feeding;Full supervision/cueing for compensatory strategies Compensations: Slow rate;Small sips/bites Postural Changes and/or Swallow Maneuvers: Seated upright 90 degrees;Upright 30-60 min after meal                Oral Care Recommendations: Oral care BID Follow up Recommendations: Skilled Nursing facility SLP Visit Diagnosis: Dysphagia, oral phase (R13.11);Aphasia (R47.01);Dysarthria and anarthria (R47.1) Plan: Continue with current plan of care       Glorianna Gott I. Vear Clock, MS, CCC-SLP Acute Rehabilitation Services Office number [phone] Pager [phone]                Scheryl Marten 02/28/2021, 9:38 AM

## 2021-02-28 NOTE — TOC Progression Note (Addendum)
Transition of Care [facility] - Progression Note    Patient Details  Name: Kristin Briggs MRN: [id number] Date of Birth: [birth year]  Transition of Care [facility]) CM/SW Contact  Mearl Latin, LCSW Phone Number: 02/28/2021, 9:22 AM  Clinical Narrative:    9:22am-[facility] going to let CSW know of bed status after their morning meeting.    10am-[facility] able to accept patient today. CSW made daughter aware and she will go there to do paperwork at 1pm.    Expected Discharge Plan: Skilled Nursing Facility Barriers to Discharge: SNF Pending bed offer  Expected Discharge Plan and Services Expected Discharge Plan: Skilled Nursing Facility In-house Referral: Clinical Social Work   Post Acute Care Choice: Skilled Nursing Facility Living arrangements for the past 2 months: Single Family Home                                       Social Determinants of Health (SDOH) Interventions    Readmission Risk Interventions No flowsheet data found.

## 2021-03-02 ENCOUNTER — Emergency Department (HOSPITAL_COMMUNITY)
Admission: EM | Admit: 2021-03-02 | Discharge: 2021-03-03 | Disposition: A | Discharge status: Home / Self Care | Payer: Medicare Other | Attending: Emergency Medicine | Admitting: Emergency Medicine

## 2021-03-02 ENCOUNTER — Other Ambulatory Visit: Payer: Self-pay

## 2021-03-02 ENCOUNTER — Encounter (HOSPITAL_COMMUNITY): Payer: Self-pay

## 2021-03-02 DIAGNOSIS — I110 Hypertensive heart disease with heart failure: Secondary | ICD-10-CM | POA: Diagnosis not present

## 2021-03-02 DIAGNOSIS — I5032 Chronic diastolic (congestive) heart failure: Secondary | ICD-10-CM | POA: Diagnosis not present

## 2021-03-02 DIAGNOSIS — R011 Cardiac murmur, unspecified: Secondary | ICD-10-CM | POA: Diagnosis not present

## 2021-03-02 DIAGNOSIS — E875 Hyperkalemia: Secondary | ICD-10-CM | POA: Insufficient documentation

## 2021-03-02 DIAGNOSIS — Z79899 Other long term (current) drug therapy: Secondary | ICD-10-CM | POA: Diagnosis not present

## 2021-03-02 DIAGNOSIS — F0390 Unspecified dementia without behavioral disturbance: Secondary | ICD-10-CM | POA: Insufficient documentation

## 2021-03-02 DIAGNOSIS — Z0000 Encounter for general adult medical examination without abnormal findings: Secondary | ICD-10-CM

## 2021-03-02 DIAGNOSIS — Z7901 Long term (current) use of anticoagulants: Secondary | ICD-10-CM | POA: Diagnosis not present

## 2021-03-02 LAB — COMPREHENSIVE METABOLIC PANEL
ALT: 15 U/L (ref 0–44)
AST: 23 U/L (ref 15–41)
Albumin: 3.1 g/dL — ABNORMAL LOW (ref 3.5–5.0)
Alkaline Phosphatase: 56 U/L (ref 38–126)
Anion gap: 7 (ref 5–15)
BUN: 22 mg/dL (ref 8–23)
CO2: 27 mmol/L (ref 22–32)
Calcium: 9.3 mg/dL (ref 8.9–10.3)
Chloride: 107 mmol/L (ref 98–111)
Creatinine, Ser: 1.03 mg/dL — ABNORMAL HIGH (ref 0.44–1.00)
GFR, Estimated: 49 mL/min — ABNORMAL LOW (ref >60)
Glucose, Bld: 90 mg/dL (ref 70–99)
Potassium: 4.1 mmol/L (ref 3.5–5.1)
Sodium: 141 mmol/L (ref 135–145)
Total Bilirubin: 0.7 mg/dL (ref 0.3–1.2)
Total Protein: 5.8 g/dL — ABNORMAL LOW (ref 6.5–8.1)

## 2021-03-02 LAB — CBC WITH DIFFERENTIAL/PLATELET
Abs Immature Granulocytes: 0.04 10*3/uL (ref 0.00–0.07)
Basophils Absolute: 0 10*3/uL (ref 0.0–0.1)
Basophils Relative: 1 %
Eosinophils Absolute: 0.3 10*3/uL (ref 0.0–0.5)
Eosinophils Relative: 3 %
HCT: 45.7 % (ref 36.0–46.0)
Hemoglobin: 13.6 g/dL (ref 12.0–15.0)
Immature Granulocytes: 1 %
Lymphocytes Relative: 19 %
Lymphs Abs: 1.6 10*3/uL (ref 0.7–4.0)
MCH: 30.4 pg (ref 26.0–34.0)
MCHC: 29.8 g/dL — ABNORMAL LOW (ref 30.0–36.0)
MCV: 102.2 fL — ABNORMAL HIGH (ref 80.0–100.0)
Monocytes Absolute: 0.9 10*3/uL (ref 0.1–1.0)
Monocytes Relative: 10 %
Neutro Abs: 5.7 10*3/uL (ref 1.7–7.7)
Neutrophils Relative %: 66 %
Platelets: 199 10*3/uL (ref 150–400)
RBC: 4.47 MIL/uL (ref 3.87–5.11)
RDW: 13.4 % (ref 11.5–15.5)
WBC: 8.5 10*3/uL (ref 4.0–10.5)
nRBC: 0 % (ref 0.0–0.2)

## 2021-03-02 LAB — I-STAT CHEM 8, ED
BUN: 23 mg/dL (ref 8–23)
Calcium, Ion: 1.15 mmol/L (ref 1.15–1.40)
Chloride: 105 mmol/L (ref 98–111)
Creatinine, Ser: 1 mg/dL (ref 0.44–1.00)
Glucose, Bld: 93 mg/dL (ref 70–99)
HCT: 41 % (ref 36.0–46.0)
Hemoglobin: 13.9 g/dL (ref 12.0–15.0)
Potassium: 4 mmol/L (ref 3.5–5.1)
Sodium: 142 mmol/L (ref 135–145)
TCO2: 29 mmol/L (ref 22–32)

## 2021-03-02 NOTE — Discharge Instructions (Addendum)
Kristin Briggs was seen in the ER for concern for abnormal lab value at facility. Her blood work was repeated in the emergency room today and is very reassuring.  Suspect lab error or unreliable blood specimen at facility.  Her physical exam, vital signs, and blood work were very reassuring, she may continue to be followed by the providers at her facility.

## 2021-03-02 NOTE — ED Notes (Signed)
[phone], daughter cell phone. Will call when pt is on the way back to the facility.

## 2021-03-02 NOTE — ED Provider Notes (Signed)
Emergency Medicine Provider Triage Evaluation Note  Kristin Briggs , a 87 y.o. female  was evaluated in triage.  Sent to emergency department by SNF due to potassium being reported at 7.9 earlier today.  Patient has a history and is alert to person only.  Patient denies any pain or discomfort at  Review of Systems  Positive:  Negative:   Unable to perfrom  Physical Exam  There were no vitals taken for this visit. Gen:   Awake, no distress   Resp:  Normal effort  MSK:   Moves extremities without difficulty  Other:    Medical Decision Making  Medically screening exam initiated at 2:10 PM.  Appropriate orders placed.  Kristin Briggs was informed that the remainder of the evaluation will be completed by another provider, this initial triage assessment does not replace that evaluation, and the importance of remaining in the ED until their evaluation is complete.  The patient appears stable so that the remainder of the work up may be completed by another provider.      Kristin Schroeder, PA-C 03/02/21 1411    Kristin Distance, MD 03/02/21 [phone]

## 2021-03-02 NOTE — ED Provider Notes (Signed)
[facility] EMERGENCY DEPARTMENT Provider Note   CSN: [id number] Arrival date & time: 03/02/21  1332     History No chief complaint on file.   Kristin Briggs is a 87 y.o. female who presents from [facility] nursing facility for concern for abnormal potassium value at their facility of 7.9.  Per paperwork at patient's bedside, otherwise asymptomatic.  Unable to obtain any further history from the patient due to underlying dementia though she is well-appearing.  I personally read this patient's medical records.  She was recently admitted to the hospital for  acute CVA and discharged on Eliquis and statin.  Also with history of PVCs.  On digoxin for A. fib.  HPI     Past Medical History:  Diagnosis Date   Cervical osteoarthritis    Chronic diastolic heart failure (HCC)    Colon abnormality    Inflammed, Per [facility] New Patient Packet   Colonic diverticular abscess    Dementia (HCC)    Dyspnea on exertion    Edema    High cholesterol    Hypertension    Impacted cerumen    Mild cognitive impairment    PVC (premature ventricular contraction)    Systolic murmur     Patient Active Problem List   Diagnosis Date Noted   Acute ischemic stroke (HCC) 02/21/2021   Cerebrovascular accident (CVA) (HCC) 02/20/2021    Past Surgical History:  Procedure Laterality Date   GALLBLADDER SURGERY  2004   Per [facility] New Patient Packet     OB History   No obstetric history on file.     Family History  Problem Relation Age of Onset   Leukemia Mother    Stroke Father    Heart attack Sister    Seizures Sister    Leukemia Sister    Stroke Brother    Ulcerative colitis Daughter     Social History   Tobacco Use   Smoking status: Never   Smokeless tobacco: Never  Vaping Use   Vaping Use: Never used  Substance Use Topics   Alcohol use: Not Currently   Drug use: Never    Home Medications Prior to Admission medications   Medication Sig Start Date End Date  Taking? Authorizing Provider  apixaban (ELIQUIS) 2.5 MG TABS tablet Take 1 tablet (2.5 mg total) by mouth 2 (two) times daily. 02/28/21   Maury Dus, MD  atorvastatin (LIPITOR) 40 MG tablet Take 1 tablet (40 mg total) by mouth daily. 03/01/21   Maury Dus, MD  digoxin 62.5 MCG TABS Take 0.0625 mg by mouth every other day. 03/01/21   Maury Dus, MD  metoprolol succinate (TOPROL-XL) 25 MG 24 hr tablet Take 0.5 tablets (12.5 mg total) by mouth daily. 03/01/21   Maury Dus, MD    Allergies    Patient has no known allergies.  Review of Systems   Review of Systems  Unable to perform ROS: Dementia   Physical Exam Updated Vital Signs BP 129/60 (BP Location: Right Arm)   Pulse 72   Temp (!) 97.5 F (36.4 C)   Resp 14   SpO2 96%   Physical Exam Vitals and nursing note reviewed.  HENT:     Head: Normocephalic and atraumatic.     Nose: Nose normal.     Mouth/Throat:     Mouth: Mucous membranes are moist.     Pharynx: No oropharyngeal exudate or posterior oropharyngeal erythema.  Eyes:     General:  Right eye: No discharge.        Left eye: No discharge.     Extraocular Movements: Extraocular movements intact.     Conjunctiva/sclera: Conjunctivae normal.     Pupils: Pupils are equal, round, and reactive to light.  Cardiovascular:     Rate and Rhythm: Normal rate and regular rhythm.     Pulses: Normal pulses.     Heart sounds: Murmur heard.  Systolic murmur is present.  Pulmonary:     Effort: Pulmonary effort is normal. No tachypnea, bradypnea, prolonged expiration or respiratory distress.     Breath sounds: Normal breath sounds. No wheezing or rales.  Chest:     Chest wall: No mass, tenderness, crepitus or edema.  Abdominal:     General: Bowel sounds are normal. There is no distension.     Palpations: Abdomen is soft.     Tenderness: There is no abdominal tenderness. There is no right CVA tenderness, left CVA tenderness, guarding or rebound.  Musculoskeletal:         General: No deformity.     Cervical back: Neck supple.     Right lower leg: No edema.     Left lower leg: No edema.  Skin:    General: Skin is warm and dry.     Capillary Refill: Capillary refill takes less than 2 seconds.       Neurological:     General: No focal deficit present.     Mental Status: She is alert. Mental status is at baseline.     Comments: Moving all four extremities spontaneously without difficulty.  At mental status baseline with underlying dementia.  Psychiatric:        Mood and Affect: Mood normal.    ED Results / Procedures / Treatments   Labs (all labs ordered are listed, but only abnormal results are displayed) Labs Reviewed  CBC WITH DIFFERENTIAL/PLATELET - Abnormal; Notable for the following components:      Result Value   MCV 102.2 (*)    MCHC 29.8 (*)    All other components within normal limits  COMPREHENSIVE METABOLIC PANEL - Abnormal; Notable for the following components:   Creatinine, Ser 1.03 (*)    Total Protein 5.8 (*)    Albumin 3.1 (*)    GFR, Estimated 49 (*)    All other components within normal limits  I-STAT CHEM 8, ED    EKG EKG Interpretation  Date/Time:  Saturday March 02 2021 13:49:58 EDT Ventricular Rate:  66 PR Interval:  176 QRS Duration: 164 QT Interval:  418 QTC Calculation: 438 R Axis:   0 Text Interpretation: Sinus rhythm with occasional Premature ventricular complexes Left bundle branch block Abnormal ECG Confirmed by Marianna Fuss ([id number]) on 03/02/2021 6:28:35 PM  Radiology No results found.  Procedures Procedures   Medications Ordered in ED Medications - No data to display  ED Course  I have reviewed the triage vital signs and the nursing notes.  Pertinent labs & imaging results that were available during my care of the patient were reviewed by me and considered in my medical decision making (see chart for details).    MDM Rules/Calculators/A&P                         87 year old female  presents for evaluation after abnormal lab value at  nursing facility with potassium 7.9.  Vital signs are normal on intake.  Basic labs were obtained in triage.  CBC unremarkable,  CMP reassuring with potassium of 4.1, stable from most recent value 4 days ago which was 3.9.  Very mild elevation in creatinine to 1.03 from baseline of 1.  Cardiopulmonary exam with systolic murmur, otherwise unremarkable.  Abdominal exam is benign.  Patient without 4 extremity spontaneously without difficulty and is neurovascularly intact in all 4 extremities.  At cognitive baseline, reassuring physical exam.  Given reassuring laboratory studies in the emergency department, no further work-up warranted in ED as time.  Recommend continued monitoring of labs as recommended at recent discharge, and monitoring by medical providers at her facility.  Suspect lab error or coagulated blood specimen at facility. No evidence of hyperkalemia at this time. No further work-up warranted in ED this time.  Patient is hemodynamic stable and appropriate for discharge back to her facility at this time.  This chart was dictated using voice recognition software, Dragon. Despite the best efforts of this provider to proofread and correct errors, errors may still occur which can change documentation meaning.   Final Clinical Impression(s) / ED Diagnoses Final diagnoses:  None    Rx / DC Orders ED Discharge Orders     None        Sherrilee Gilles 03/02/21 1859    Milagros Loll, MD 03/03/21 [phone]

## 2021-03-02 NOTE — ED Notes (Signed)
Beckey Downing, pt daughter, and updated on pt status.

## 2021-03-02 NOTE — ED Triage Notes (Signed)
Patient arrived from [facility] nursing facility for potassium coming back 7.9 today. Patient discharged on 6/28 from [facility] with potassium of 3.9. patient also on Digoxin and that level was not checked. She is a new admit to this facility-dementia

## 2021-04-01 ENCOUNTER — Ambulatory Visit: Payer: Medicare Other | Admitting: Nurse Practitioner

## 2021-04-08 ENCOUNTER — Other Ambulatory Visit: Payer: Medicare Other | Admitting: *Deleted

## 2021-04-08 ENCOUNTER — Other Ambulatory Visit: Payer: Medicare Other

## 2021-04-08 ENCOUNTER — Other Ambulatory Visit: Payer: Self-pay

## 2021-04-08 VITALS — BP 114/83 | HR 81 | Temp 97.9°F | Resp 16

## 2021-04-08 DIAGNOSIS — Z515 Encounter for palliative care: Secondary | ICD-10-CM

## 2021-04-08 NOTE — Progress Notes (Signed)
COMMUNITY PALLIATIVE CARE SW NOTE  PATIENT NAME: Kristin Briggs DOB: 22-Feb-1920 MRN: [id number]  PRIMARY CARE PROVIDER: Pcp, No  RESPONSIBLE PARTY:  Acct ID - Guarantor Home Phone Work Phone Relationship Acct Type  [id number] - Baskette,DOROT* [phone]  Self P/F     [address], [place], [place] [address]     PLAN OF CARE and INTERVENTIONS:             GOALS OF CARE/ ADVANCE CARE PLANNING:  Goal is for patient to remain at home. Patient is a DNR.  SOCIAL/EMOTIONAL/SPIRITUAL ASSESSMENT/ INTERVENTIONS:  SW and RN-M.Dimas Aguas completed an initial visit with patient at her home where she was present with her daughter and other family members. Patient's daughter provided a status update to the team as she feels patient is approaching end-of-life. Patient is bedbound and total care. She takes her medication crushed and in applesauce, however patient has not had medication in about three days. Her daughter reported that patient coughed up green bile yesterday and had a terrible coughing episode that lasted about 20 minutes yesterday. Patient is having difficulty drinking from a straw and swallowing. Her daughter suspects that patient had another stroke on Friday as her speech is worse, she is more confused, she has been calling out for family members and stating that she is getting ready to die. The daughter also reports that patient is sleeping more and fidgets with her hands while sleeping and also cries. Weight loss is noted by the daughter. Patient has not upper body strength as she is unable to pull herself up, turn or sit up without assistance.. She does wear diapers. The team observed patient in bed. Her speech was slurred and difficult to understand. Patient seems to have intermittent confusion. RN to discuss patient status with the medical director. The family was provided education regarding hospice, the difference between hospice and palliative care, the team approach and the benefits. The  family would like to proceed with a hospice consult.  PATIENT/CAREGIVER EDUCATION/ COPING: The family is supportive and realistic regarding patient list.  PERSONAL EMERGENCY PLAN:  911 can be accessed for emergencies. COMMUNITY RESOURCES COORDINATION/ HEALTH CARE NAVIGATION:  Patient was referred for PT and OT. FINANCIAL/LEGAL CONCERNS/INTERVENTIONS:  None.     SOCIAL HX:  Social History   Tobacco Use   Smoking status: Never   Smokeless tobacco: Never  Substance Use Topics   Alcohol use: Not Currently    CODE STATUS: DNR ADVANCED DIRECTIVES: Yes MOST FORM COMPLETE:  No HOSPICE EDUCATION PROVIDED: Yes, consult is requested.   PPS: Patient is bedbound and total care.   Duration of visit and documentation: 60 minutes  Clydia Llano, LCSW

## 2021-04-08 NOTE — Progress Notes (Signed)
[facility] PALLIATIVE CARE RN NOTE  PATIENT NAME: Kristin Briggs DOB: March 15, 1920 MRN: [id number]  PRIMARY CARE PROVIDER: Pcp, No  RESPONSIBLE PARTY: Charolette Child (daughter) Acct ID - Guarantor Home Phone Work Phone Relationship Acct Type  [id number] - Dermody,[phone]  Self P/F     [address], [place], [place] [address]   Covid-19 Pre-screening Negative  PLAN OF CARE and INTERVENTION:  ADVANCE CARE PLANNING/GOALS OF CARE: Goal is for patient to remain in the home with her family and avoid hospitalizations. Comfort measures only. She has a DNR. PATIENT/CAREGIVER EDUCATION: Explained palliative care services vs hospice, symptom management, aspiration precautions, disease progression DISEASE STATUS: Joint face-to-face initial palliative care visit completed with LCSW, M. Lonon. Met with patient and family in their home. Patient had a CVA 2 months ago. Prior to this she was ambulatory, dressing herself, fixing her own coffee, etc. She only needed assistance with her showers. She is now total care with all ADLs. She is only taking sips of [employer] Instant breakfast, but is now having difficulties drinking via straw. She also would eat a few bites of watermelon or grapes, but is no longer eating this. Medications are being given crushed in applesauce or pudding. She is swallowing the applesauce/pudding but holding the pills in her mouth. She is having significant coughing spells, one lasted about 20 minutes when trying to eat along with an overproduction of phlegm. She had an episode of nausea and vomiting up green bile several times yesterday. Daughter has not given her any food/fluid/medications today in fear of patient choking. Patient lying in bed and is alert and able to respond. Family thinks she had a stroke this past Friday because now her speech is mainly unintelligible, with only a few clear words. She is bed bound and non-weight bearing. Patient sleeps all day and night other  than maybe an hour or so. During her sleep, patient becomes very fidgety with her hands and even has crying spells. Daughter states that patient is aware when she has to urinate or have a bowel movement, but she will go in the diaper and family will clean her up. She continues to have daily bowel movements. She went to [facility] after her hospitalization 2 months ago, but family picked her up and brought her home early due to non-progression with therapy. She is currently receiving in home PT through [facility], but is going to cancel to pursue hospice care. Daughter just wants to stop giving her medications and focus on comfort care only. I discussed patient condition with our [facility] hospice medical director Dr. Konrad Dolores who agrees that patient is eligible for hospice. I contacted Dr. Daphene Jaeger and received a verbal order for a hospice consult and he agrees to be the attending while patient is under hospice care. Contacted and spoke with patient's daughter to advise of above noted information and that a referral specialist will be contacting her to arrange date/time of hospice admission visit. She is appreciative.   HISTORY OF PRESENT ILLNESS:  This is a 87 yo female with a diagnosis of CVA. She has a history of dementia. Palliative care was asked to follow patient for goals of care and complex decision making. During visit, I felt patient was more appropriate for hospice care vs palliative care. Verbal order for hospice consult received today from Dr. Daphene Jaeger.  CODE STATUS: DNR ADVANCED DIRECTIVES: Y MOST FORM: no PPS: 20%   PHYSICAL EXAM:   VITALS: Today's Vitals   04/08/21 1135  BP: 114/83  Pulse:  81  Resp: 16  Temp: 97.9 F (36.6 C)  TempSrc: Temporal  SpO2: 97%  PainSc: 0-No pain    LUNGS: clear to auscultation  CARDIAC: Cor RRR EXTREMITIES: No edema SKIN:  No open areas per daughter; some reddened areas starting to patient's bottom; bruises easily   NEURO:  Alert and oriented  to self, intermittent confusion, mostly unintelligible speech, total care, bedbound   (Duration of visit and documentation 90 minutes)   Daryl Eastern, RN BSN
